# Patient Record
Sex: Female | Born: 2000 | Race: Black or African American | Hispanic: No | Marital: Single | Smoking: Never smoker
Health system: Southern US, Community
[De-identification: ages and names within clinical notes are randomized; demographics above are authoritative.]

## PROBLEM LIST (undated history)

## (undated) ENCOUNTER — Inpatient Hospital Stay (HOSPITAL_COMMUNITY): Payer: Self-pay

## (undated) DIAGNOSIS — J45909 Unspecified asthma, uncomplicated: Secondary | ICD-10-CM

## (undated) HISTORY — PX: NO PAST SURGERIES: SHX2092

---
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
## 2021-03-23 DIAGNOSIS — Z8619 Personal history of other infectious and parasitic diseases: Secondary | ICD-10-CM | POA: Diagnosis not present

## 2021-03-23 DIAGNOSIS — R300 Dysuria: Secondary | ICD-10-CM | POA: Diagnosis not present

## 2021-03-23 DIAGNOSIS — N898 Other specified noninflammatory disorders of vagina: Secondary | ICD-10-CM | POA: Diagnosis not present

## 2021-05-28 ENCOUNTER — Other Ambulatory Visit: Payer: Self-pay

## 2021-05-28 ENCOUNTER — Inpatient Hospital Stay (HOSPITAL_COMMUNITY)
Admission: AD | Admit: 2021-05-28 | Discharge: 2021-05-28 | Disposition: A | Discharge status: Home / Self Care | Payer: BC Managed Care – PPO | Attending: Obstetrics and Gynecology | Admitting: Obstetrics and Gynecology

## 2021-05-28 DIAGNOSIS — Z3202 Encounter for pregnancy test, result negative: Secondary | ICD-10-CM | POA: Diagnosis not present

## 2021-05-28 LAB — POCT PREGNANCY, URINE: Preg Test, Ur: NEGATIVE

## 2021-05-28 LAB — ABO/RH: ABO/RH(D): B POS

## 2021-05-28 LAB — HCG, QUANTITATIVE, PREGNANCY: hCG, Beta Chain, Quant, S: 5 m[IU]/mL — ABNORMAL HIGH (ref <5)

## 2021-05-28 NOTE — MAU Provider Note (Addendum)
Rebecca Noble is a 20 y.o. who presents to MAU today for pbleeding in the setting of 4 positive HPT, which she took 2 days ago. Having some cramping. Bleeding is similar to a period. regnancy verification. The patient denies abdominal pain or vaginal bleeding today.   BP 134/80 (BP Location: Right Arm)   Pulse 82   Temp 98.2 F (36.8 C) (Oral)   Resp 16   Ht 4\' 11"  (1.499 m)   Wt 52.2 kg   LMP 04/24/2021   SpO2 99%   BMI 23.25 kg/m   CONSTITUTIONAL: Well-developed, well-nourished female in no acute distress.  MUSCULOSKELETAL: Normal range of motion.  CARDIOVASCULAR: Regular heart rate RESPIRATORY: Normal effort NEUROLOGICAL: Alert and oriented to person, place, and time.  SKIN: No pallor. PSYCH: Normal mood and affect. Normal behavior. Normal judgment and thought content.  Results for orders placed or performed during the hospital encounter of 05/28/21 (from the past 24 hour(s))  Pregnancy, urine POC     Status: None   Collection Time: 05/28/21 10:44 AM  Result Value Ref Range   Preg Test, Ur NEGATIVE NEGATIVE  hCG, quantitative, pregnancy     Status: Abnormal   Collection Time: 05/28/21 11:19 AM  Result Value Ref Range   hCG, Beta Chain, Quant, S 5 (H) <5 mIU/mL  ABO/Rh     Status: None   Collection Time: 05/28/21 11:19 AM  Result Value Ref Range   ABO/RH(D) B POS    No rh immune globuloin      NOT A RH IMMUNE GLOBULIN CANDIDATE, PT RH POSITIVE Performed at [facility], [address], [place], [place]    MDM UPT negative, will draw quant and call pt with results. Consult with Dr. [id number], plan for f/u in 1 week.  1330: Notified pt of qhcg of 5, likely failed pregnancy, recommend f/u in 1 week for rpt qhcg.   A: 1. Negative pregnancy test     P: Follow up at [facility] in 1 week- office staff notified Discharge home Patient may return to MAU for pregnancy emergencies  [facility] Donette Larry  05/28/2021 1:30 PM

## 2021-05-28 NOTE — MAU Note (Signed)
+  HPT x4.  Tried to get into the health dept, they told her " their test doesn't pick up until you are 6 wks". Started bleeding yesterday, started as light pink, then became heavier.  Started cramping, doesn't know what is going on.

## 2021-06-04 DIAGNOSIS — O039 Complete or unspecified spontaneous abortion without complication: Secondary | ICD-10-CM | POA: Diagnosis not present

## 2021-06-18 DIAGNOSIS — O039 Complete or unspecified spontaneous abortion without complication: Secondary | ICD-10-CM | POA: Diagnosis not present

## 2021-09-16 NOTE — L&D Delivery Note (Signed)
Delivery Note    Patient Name: Rebecca Noble DOB: [birth year] MRN: [id number]  Date of admission: 06/21/2022 Delivering MD: Noralyn Pick  Date of delivery: 06/21/2022 Type of delivery: SVD  Newborn Data: Live born female  Birth Weight:   APGAR: 76, 60   Newborn Delivery   Birth date/time: [birth year]/2022 21:11:00 Delivery type:      Rebecca Noble, 21 y.o., @ [redacted]w[redacted]d,  G1P0, who was admitted for spontaneous latent labor. I was called to the room when she progressed 2+ station in the second stage of labor.  She pushed for 45/min.  She delivered a viable infant, cephalic and restituted to the LOA position over an intact perineum.  A nuchal cord   was not identified. The baby was placed on maternal abdomen while initial step of NRP were perfmored (Dry, Stimulated, and warmed). Hat placed on baby for thermoregulation. Delayed cord clamping was performed for 2 minutes.  Cord double clamped and cut.  Cord cut by FOB. Apgar scores were 8 and 9. Prophylactic Pitocin was started in the third stage of labor for active management. The placenta delivered spontaneously, shultz, with a 3 vessel cord and was sent to LD.  Inspection revealed none. An examination of the vaginal vault and cervix was free from lacerations. The uterus was firm, bleeding stable.   Placenta and umbilical artery blood gas were not sent.  There were no complications during the procedure.  Mom and baby skin to skin following delivery. Left in stable condition.  Maternal Info: Anesthesia: Epidural Episiotomy: no Lacerations:  no Suture Repair: no Est. Blood Loss (mL):  142mls  Newborn Info:  Baby Sex: female Circumcision: N/A Babies Name: Rebecca Noble APGAR (1 MIN):  8 APGAR (5 MINS):  9 APGAR (10 MINS):     Mom to postpartum.  Baby to Couplet care / Skin to Skin.   [place], FNP-C, PMHNP-BC  [address] [place], [place] [address]  Cell: [phone]  Office Phone: [phone] Fax: [phone] 06/21/2022  9:45  PM

## 2021-09-23 ENCOUNTER — Emergency Department (HOSPITAL_COMMUNITY)
Admission: EM | Admit: 2021-09-23 | Discharge: 2021-09-24 | Disposition: A | Discharge status: Home / Self Care | Payer: BC Managed Care – PPO | Attending: Emergency Medicine | Admitting: Emergency Medicine

## 2021-09-23 ENCOUNTER — Other Ambulatory Visit: Payer: Self-pay

## 2021-09-23 DIAGNOSIS — R112 Nausea with vomiting, unspecified: Secondary | ICD-10-CM | POA: Insufficient documentation

## 2021-09-23 DIAGNOSIS — Z5321 Procedure and treatment not carried out due to patient leaving prior to being seen by health care provider: Secondary | ICD-10-CM | POA: Diagnosis not present

## 2021-09-23 DIAGNOSIS — R6883 Chills (without fever): Secondary | ICD-10-CM | POA: Diagnosis not present

## 2021-09-23 DIAGNOSIS — N9489 Other specified conditions associated with female genital organs and menstrual cycle: Secondary | ICD-10-CM | POA: Insufficient documentation

## 2021-09-23 DIAGNOSIS — R55 Syncope and collapse: Secondary | ICD-10-CM | POA: Diagnosis not present

## 2021-09-23 LAB — LIPASE, BLOOD: Lipase: 24 U/L (ref 11–51)

## 2021-09-23 LAB — CBC WITH DIFFERENTIAL/PLATELET
Abs Immature Granulocytes: 0.05 10*3/uL (ref 0.00–0.07)
Basophils Absolute: 0 10*3/uL (ref 0.0–0.1)
Basophils Relative: 0 %
Eosinophils Absolute: 0 10*3/uL (ref 0.0–0.5)
Eosinophils Relative: 0 %
HCT: 41.4 % (ref 36.0–46.0)
Hemoglobin: 14.7 g/dL (ref 12.0–15.0)
Immature Granulocytes: 0 %
Lymphocytes Relative: 2 %
Lymphs Abs: 0.3 10*3/uL — ABNORMAL LOW (ref 0.7–4.0)
MCH: 30.2 pg (ref 26.0–34.0)
MCHC: 35.5 g/dL (ref 30.0–36.0)
MCV: 85 fL (ref 80.0–100.0)
Monocytes Absolute: 0.3 10*3/uL (ref 0.1–1.0)
Monocytes Relative: 2 %
Neutro Abs: 12.9 10*3/uL — ABNORMAL HIGH (ref 1.7–7.7)
Neutrophils Relative %: 96 %
Platelets: 412 10*3/uL — ABNORMAL HIGH (ref 150–400)
RBC: 4.87 MIL/uL (ref 3.87–5.11)
RDW: 12.1 % (ref 11.5–15.5)
WBC: 13.5 10*3/uL — ABNORMAL HIGH (ref 4.0–10.5)
nRBC: 0 % (ref 0.0–0.2)

## 2021-09-23 LAB — COMPREHENSIVE METABOLIC PANEL
ALT: 24 U/L (ref 0–44)
AST: 30 U/L (ref 15–41)
Albumin: 4.3 g/dL (ref 3.5–5.0)
Alkaline Phosphatase: 80 U/L (ref 38–126)
Anion gap: 12 (ref 5–15)
BUN: 8 mg/dL (ref 6–20)
CO2: 22 mmol/L (ref 22–32)
Calcium: 9.6 mg/dL (ref 8.9–10.3)
Chloride: 105 mmol/L (ref 98–111)
Creatinine, Ser: 0.75 mg/dL (ref 0.44–1.00)
GFR, Estimated: >60 mL/min (ref >60)
Glucose, Bld: 119 mg/dL — ABNORMAL HIGH (ref 70–99)
Potassium: 3.4 mmol/L — ABNORMAL LOW (ref 3.5–5.1)
Sodium: 139 mmol/L (ref 135–145)
Total Bilirubin: 0.9 mg/dL (ref 0.3–1.2)
Total Protein: 8.1 g/dL (ref 6.5–8.1)

## 2021-09-23 MED ORDER — SODIUM CHLORIDE 0.9 % IV BOLUS
1000.0000 mL | Freq: Once | INTRAVENOUS | Status: AC
  Administered 2021-09-23: 1000 mL via INTRAVENOUS

## 2021-09-23 MED ORDER — ONDANSETRON HCL 4 MG/2ML IJ SOLN
4.0000 mg | Freq: Once | INTRAMUSCULAR | Status: AC
  Administered 2021-09-23: 4 mg via INTRAVENOUS
  Filled 2021-09-23: qty 2

## 2021-09-23 NOTE — ED Notes (Signed)
Pt was standing up walking around in front of her mother and then passed out into mothers arms. This NT and EMT Iantha Fallen assisted pt back to chair and pt awakened. Vitals reassessed and triage RN made aware.

## 2021-09-23 NOTE — ED Provider Triage Note (Addendum)
Emergency Medicine Provider Triage Evaluation Note  Rebecca Noble , a 21 y.o. female  was evaluated in triage.  Pt complains of nausea and vomiting.  Other siblings also sick with similar.  Everyone in the house took at home covid test today-- all were negative. She did have syncopal episode this evening that prompted ED visit.  Review of Systems  Positive: Nausea, vomiting Negative: fever  Physical Exam  BP 126/73 (BP Location: Left Arm)    Pulse 89    Temp 98.9 F (37.2 C) (Oral)    Resp 17    Ht 4\' 11"  (1.499 m)    Wt 54.4 kg    SpO2 100%    BMI 24.24 kg/m   Gen:   Awake, no distress   Resp:  Normal effort  MSK:   Moves extremities without difficulty  Other:    Medical Decision Making  Medically screening exam initiated at 10:24 PM.  Appropriate orders placed.  Rebecca Noble was informed that the remainder of the evaluation will be completed by another provider, this initial triage assessment does not replace that evaluation, and the importance of remaining in the ED until their evaluation is complete.  Nausea and vomiting, syncopal event.  AAOx3 currently.  VSS.  EKG, labs.  Suspect likely viral process given sick siblings with same.  11:46 PM Brought back to triage after near syncopal event in the lobby after walking to bathroom.  Will insert PIV, start IVFB.   Noreene Filbert, PA-C 09/23/21 2230    11/21/21, PA-C 09/23/21 [phone]

## 2021-09-23 NOTE — ED Triage Notes (Signed)
Pt c/o nausea, vomiting, chills, and syncope since earlier today.

## 2021-09-24 LAB — I-STAT BETA HCG BLOOD, ED (MC, WL, AP ONLY): I-stat hCG, quantitative: <5 m[IU]/mL (ref <5)

## 2021-09-24 NOTE — ED Notes (Signed)
Pt stated that IV was hurting and requesting it to be removed.

## 2021-09-24 NOTE — ED Notes (Signed)
Pt did not want to stay and left AMA. °

## 2021-11-05 DIAGNOSIS — Z0000 Encounter for general adult medical examination without abnormal findings: Secondary | ICD-10-CM | POA: Diagnosis not present

## 2021-11-07 DIAGNOSIS — Z3201 Encounter for pregnancy test, result positive: Secondary | ICD-10-CM | POA: Diagnosis not present

## 2021-11-07 DIAGNOSIS — O26891 Other specified pregnancy related conditions, first trimester: Secondary | ICD-10-CM | POA: Diagnosis not present

## 2021-12-03 DIAGNOSIS — Z3481 Encounter for supervision of other normal pregnancy, first trimester: Secondary | ICD-10-CM | POA: Diagnosis not present

## 2022-01-02 DIAGNOSIS — Z3482 Encounter for supervision of other normal pregnancy, second trimester: Secondary | ICD-10-CM | POA: Diagnosis not present

## 2022-01-17 DIAGNOSIS — L089 Local infection of the skin and subcutaneous tissue, unspecified: Secondary | ICD-10-CM | POA: Diagnosis not present

## 2022-01-17 DIAGNOSIS — L03317 Cellulitis of buttock: Secondary | ICD-10-CM | POA: Diagnosis not present

## 2022-01-17 DIAGNOSIS — B9689 Other specified bacterial agents as the cause of diseases classified elsewhere: Secondary | ICD-10-CM | POA: Diagnosis not present

## 2022-02-04 DIAGNOSIS — Z36 Encounter for antenatal screening for chromosomal anomalies: Secondary | ICD-10-CM | POA: Diagnosis not present

## 2022-02-04 DIAGNOSIS — O35EXX0 Maternal care for other (suspected) fetal abnormality and damage, fetal genitourinary anomalies, not applicable or unspecified: Secondary | ICD-10-CM | POA: Diagnosis not present

## 2022-02-04 DIAGNOSIS — Z3490 Encounter for supervision of normal pregnancy, unspecified, unspecified trimester: Secondary | ICD-10-CM | POA: Diagnosis not present

## 2022-02-10 ENCOUNTER — Other Ambulatory Visit: Payer: Self-pay

## 2022-02-10 ENCOUNTER — Encounter (HOSPITAL_COMMUNITY): Payer: Self-pay

## 2022-02-10 ENCOUNTER — Inpatient Hospital Stay (HOSPITAL_BASED_OUTPATIENT_CLINIC_OR_DEPARTMENT_OTHER): Payer: BC Managed Care – PPO

## 2022-02-10 ENCOUNTER — Inpatient Hospital Stay (HOSPITAL_COMMUNITY)
Admission: AD | Admit: 2022-02-10 | Discharge: 2022-02-10 | Disposition: A | Discharge status: Home / Self Care | Payer: BC Managed Care – PPO | Attending: Obstetrics and Gynecology | Admitting: Obstetrics and Gynecology

## 2022-02-10 DIAGNOSIS — W228XXA Striking against or struck by other objects, initial encounter: Secondary | ICD-10-CM | POA: Insufficient documentation

## 2022-02-10 DIAGNOSIS — O9A212 Injury, poisoning and certain other consequences of external causes complicating pregnancy, second trimester: Secondary | ICD-10-CM | POA: Diagnosis not present

## 2022-02-10 DIAGNOSIS — S3991XA Unspecified injury of abdomen, initial encounter: Secondary | ICD-10-CM | POA: Insufficient documentation

## 2022-02-10 DIAGNOSIS — O9A219 Injury, poisoning and certain other consequences of external causes complicating pregnancy, unspecified trimester: Secondary | ICD-10-CM | POA: Diagnosis not present

## 2022-02-10 DIAGNOSIS — W500XXA Accidental hit or strike by another person, initial encounter: Secondary | ICD-10-CM

## 2022-02-10 DIAGNOSIS — Z3A21 21 weeks gestation of pregnancy: Secondary | ICD-10-CM

## 2022-02-10 DIAGNOSIS — Z3492 Encounter for supervision of normal pregnancy, unspecified, second trimester: Secondary | ICD-10-CM

## 2022-02-10 DIAGNOSIS — R109 Unspecified abdominal pain: Secondary | ICD-10-CM | POA: Insufficient documentation

## 2022-02-10 LAB — URINALYSIS, ROUTINE W REFLEX MICROSCOPIC
Bilirubin Urine: NEGATIVE
Glucose, UA: NEGATIVE mg/dL
Hgb urine dipstick: NEGATIVE
Ketones, ur: NEGATIVE mg/dL
Leukocytes,Ua: NEGATIVE
Nitrite: NEGATIVE
Protein, ur: NEGATIVE mg/dL
Specific Gravity, Urine: 1.01 (ref 1.005–1.030)
pH: 6 (ref 5.0–8.0)

## 2022-02-10 LAB — POCT PREGNANCY, URINE: Preg Test, Ur: POSITIVE — AB

## 2022-02-10 MED ORDER — ACETAMINOPHEN 325 MG PO TABS: 650.0000 mg | ORAL_TABLET | ORAL | 0 refills | Status: AC | PRN

## 2022-02-10 MED ORDER — CYCLOBENZAPRINE HCL 5 MG PO TABS
10.0000 mg | ORAL_TABLET | Freq: Once | ORAL | Status: AC
  Administered 2022-02-10: 10 mg via ORAL
  Filled 2022-02-10: qty 2

## 2022-02-10 MED ORDER — ACETAMINOPHEN 500 MG PO TABS
1000.0000 mg | ORAL_TABLET | Freq: Once | ORAL | Status: AC
  Administered 2022-02-10: 1000 mg via ORAL
  Filled 2022-02-10: qty 2

## 2022-02-10 MED ORDER — CYCLOBENZAPRINE HCL 10 MG PO TABS: 10.0000 mg | ORAL_TABLET | Freq: Two times a day (BID) | ORAL | 0 refills | Status: AC | PRN
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
## 2022-03-04 DIAGNOSIS — Z3482 Encounter for supervision of other normal pregnancy, second trimester: Secondary | ICD-10-CM | POA: Diagnosis not present

## 2022-03-04 DIAGNOSIS — Z3490 Encounter for supervision of normal pregnancy, unspecified, unspecified trimester: Secondary | ICD-10-CM | POA: Diagnosis not present

## 2022-03-04 LAB — OB RESULTS CONSOLE ABO/RH: RH Type: POSITIVE

## 2022-03-04 LAB — OB RESULTS CONSOLE GC/CHLAMYDIA
Chlamydia: NEGATIVE
Neisseria Gonorrhea: NEGATIVE

## 2022-03-04 LAB — OB RESULTS CONSOLE RPR: RPR: NONREACTIVE

## 2022-03-04 LAB — OB RESULTS CONSOLE HEPATITIS B SURFACE ANTIGEN: Hepatitis B Surface Ag: NEGATIVE

## 2022-03-04 LAB — OB RESULTS CONSOLE HIV ANTIBODY (ROUTINE TESTING): HIV: NONREACTIVE

## 2022-03-04 LAB — HEPATITIS C ANTIBODY: HCV Ab: NEGATIVE

## 2022-03-04 LAB — OB RESULTS CONSOLE RUBELLA ANTIBODY, IGM: Rubella: IMMUNE

## 2022-04-05 DIAGNOSIS — Z23 Encounter for immunization: Secondary | ICD-10-CM | POA: Diagnosis not present

## 2022-04-05 DIAGNOSIS — Z3483 Encounter for supervision of other normal pregnancy, third trimester: Secondary | ICD-10-CM | POA: Diagnosis not present

## 2022-04-10 DIAGNOSIS — B3731 Acute candidiasis of vulva and vagina: Secondary | ICD-10-CM | POA: Diagnosis not present

## 2022-04-24 DIAGNOSIS — Z3483 Encounter for supervision of other normal pregnancy, third trimester: Secondary | ICD-10-CM | POA: Diagnosis not present

## 2022-05-30 DIAGNOSIS — Z3483 Encounter for supervision of other normal pregnancy, third trimester: Secondary | ICD-10-CM | POA: Diagnosis not present

## 2022-05-30 DIAGNOSIS — O26893 Other specified pregnancy related conditions, third trimester: Secondary | ICD-10-CM | POA: Diagnosis not present

## 2022-05-30 DIAGNOSIS — O26843 Uterine size-date discrepancy, third trimester: Secondary | ICD-10-CM | POA: Diagnosis not present

## 2022-05-30 DIAGNOSIS — Z3490 Encounter for supervision of normal pregnancy, unspecified, unspecified trimester: Secondary | ICD-10-CM | POA: Diagnosis not present

## 2022-05-30 LAB — OB RESULTS CONSOLE GBS: GBS: NEGATIVE

## 2022-06-04 DIAGNOSIS — R112 Nausea with vomiting, unspecified: Secondary | ICD-10-CM | POA: Diagnosis not present

## 2022-06-21 ENCOUNTER — Encounter (HOSPITAL_COMMUNITY): Payer: Self-pay | Admitting: Obstetrics and Gynecology

## 2022-06-21 ENCOUNTER — Inpatient Hospital Stay (HOSPITAL_COMMUNITY): Payer: BC Managed Care – PPO | Admitting: Anesthesiology

## 2022-06-21 ENCOUNTER — Inpatient Hospital Stay (HOSPITAL_COMMUNITY)
Admission: AD | Admit: 2022-06-21 | Discharge: 2022-06-23 | DRG: 806 | Disposition: A | Discharge status: Home / Self Care | Payer: BC Managed Care – PPO | Attending: Obstetrics & Gynecology | Admitting: Obstetrics & Gynecology

## 2022-06-21 ENCOUNTER — Inpatient Hospital Stay (EMERGENCY_DEPARTMENT_HOSPITAL)
Admission: AD | Admit: 2022-06-21 | Discharge: 2022-06-21 | Disposition: A | Discharge status: Home / Self Care | Payer: BC Managed Care – PPO | Source: Home / Self Care | Attending: Obstetrics and Gynecology | Admitting: Obstetrics and Gynecology

## 2022-06-21 ENCOUNTER — Other Ambulatory Visit: Payer: Self-pay

## 2022-06-21 DIAGNOSIS — O471 False labor at or after 37 completed weeks of gestation: Secondary | ICD-10-CM | POA: Insufficient documentation

## 2022-06-21 DIAGNOSIS — O139 Gestational [pregnancy-induced] hypertension without significant proteinuria, unspecified trimester: Secondary | ICD-10-CM | POA: Diagnosis not present

## 2022-06-21 DIAGNOSIS — O212 Late vomiting of pregnancy: Secondary | ICD-10-CM | POA: Insufficient documentation

## 2022-06-21 DIAGNOSIS — O479 False labor, unspecified: Secondary | ICD-10-CM | POA: Diagnosis not present

## 2022-06-21 DIAGNOSIS — D62 Acute posthemorrhagic anemia: Secondary | ICD-10-CM | POA: Diagnosis not present

## 2022-06-21 DIAGNOSIS — O26893 Other specified pregnancy related conditions, third trimester: Secondary | ICD-10-CM | POA: Diagnosis not present

## 2022-06-21 DIAGNOSIS — Z3A39 39 weeks gestation of pregnancy: Secondary | ICD-10-CM

## 2022-06-21 DIAGNOSIS — O134 Gestational [pregnancy-induced] hypertension without significant proteinuria, complicating childbirth: Secondary | ICD-10-CM | POA: Diagnosis not present

## 2022-06-21 DIAGNOSIS — O9081 Anemia of the puerperium: Secondary | ICD-10-CM | POA: Diagnosis not present

## 2022-06-21 DIAGNOSIS — J45909 Unspecified asthma, uncomplicated: Secondary | ICD-10-CM | POA: Diagnosis not present

## 2022-06-21 DIAGNOSIS — O9952 Diseases of the respiratory system complicating childbirth: Secondary | ICD-10-CM | POA: Diagnosis not present

## 2022-06-21 LAB — CBC
HCT: 35.3 % — ABNORMAL LOW (ref 36.0–46.0)
Hemoglobin: 11.9 g/dL — ABNORMAL LOW (ref 12.0–15.0)
MCH: 27.9 pg (ref 26.0–34.0)
MCHC: 33.7 g/dL (ref 30.0–36.0)
MCV: 82.7 fL (ref 80.0–100.0)
Platelets: 313 10*3/uL (ref 150–400)
RBC: 4.27 MIL/uL (ref 3.87–5.11)
RDW: 12.4 % (ref 11.5–15.5)
WBC: 13.9 10*3/uL — ABNORMAL HIGH (ref 4.0–10.5)
nRBC: 0 % (ref 0.0–0.2)

## 2022-06-21 LAB — TYPE AND SCREEN
ABO/RH(D): B POS
Antibody Screen: NEGATIVE

## 2022-06-21 MED ORDER — DIPHENHYDRAMINE HCL 50 MG/ML IJ SOLN: 12.5000 mg | INTRAMUSCULAR | Status: DC | PRN

## 2022-06-21 MED ORDER — ONDANSETRON HCL 4 MG/2ML IJ SOLN: 4.0000 mg | INTRAMUSCULAR | Status: DC | PRN

## 2022-06-21 MED ORDER — FLEET ENEMA 7-19 GM/118ML RE ENEM: 1.0000 | ENEMA | RECTAL | Status: DC | PRN

## 2022-06-21 MED ORDER — OXYTOCIN-SODIUM CHLORIDE 30-0.9 UT/500ML-% IV SOLN
2.5000 [IU]/h | INTRAVENOUS | Status: DC
  Administered 2022-06-21: 2.5 [IU]/h via INTRAVENOUS
  Filled 2022-06-21: qty 500

## 2022-06-21 MED ORDER — LACTATED RINGERS IV SOLN: INTRAVENOUS | Status: DC

## 2022-06-21 MED ORDER — BENZOCAINE-MENTHOL 20-0.5 % EX AERO
1.0000 | INHALATION_SPRAY | CUTANEOUS | Status: DC | PRN
  Administered 2022-06-22 (×2): 1 via TOPICAL
  Filled 2022-06-21 (×2): qty 56

## 2022-06-21 MED ORDER — DIPHENHYDRAMINE HCL 25 MG PO CAPS
25.0000 mg | ORAL_CAPSULE | Freq: Four times a day (QID) | ORAL | Status: DC | PRN
  Administered 2022-06-22: 25 mg via ORAL
  Filled 2022-06-21: qty 1

## 2022-06-21 MED ORDER — FENTANYL-BUPIVACAINE-NACL 0.5-0.125-0.9 MG/250ML-% EP SOLN
EPIDURAL | Status: DC | PRN
  Administered 2022-06-21: 12 mL/h via EPIDURAL

## 2022-06-21 MED ORDER — OXYCODONE-ACETAMINOPHEN 5-325 MG PO TABS: 2.0000 | ORAL_TABLET | ORAL | Status: DC | PRN

## 2022-06-21 MED ORDER — SIMETHICONE 80 MG PO CHEW: 80.0000 mg | CHEWABLE_TABLET | ORAL | Status: DC | PRN

## 2022-06-21 MED ORDER — SENNOSIDES-DOCUSATE SODIUM 8.6-50 MG PO TABS
2.0000 | ORAL_TABLET | Freq: Every day | ORAL | Status: DC
  Administered 2022-06-22 – 2022-06-23 (×2): 2 via ORAL
  Filled 2022-06-21 (×2): qty 2

## 2022-06-21 MED ORDER — COCONUT OIL OIL: 1.0000 | TOPICAL_OIL | Status: DC | PRN

## 2022-06-21 MED ORDER — ONDANSETRON 4 MG PO TBDP
4.0000 mg | ORAL_TABLET | Freq: Once | ORAL | Status: AC
  Administered 2022-06-21: 4 mg via ORAL
  Filled 2022-06-21: qty 1

## 2022-06-21 MED ORDER — LIDOCAINE HCL (PF) 1 % IJ SOLN
INTRAMUSCULAR | Status: DC | PRN
  Administered 2022-06-21: 5 mL via EPIDURAL

## 2022-06-21 MED ORDER — SOD CITRATE-CITRIC ACID 500-334 MG/5ML PO SOLN: 30.0000 mL | ORAL | Status: DC | PRN

## 2022-06-21 MED ORDER — MORPHINE SULFATE (PF) 4 MG/ML IV SOLN
4.0000 mg | Freq: Once | INTRAVENOUS | Status: AC
  Administered 2022-06-21: 4 mg via INTRAMUSCULAR
  Filled 2022-06-21: qty 1

## 2022-06-21 MED ORDER — OXYCODONE-ACETAMINOPHEN 5-325 MG PO TABS: 1.0000 | ORAL_TABLET | ORAL | Status: DC | PRN

## 2022-06-21 MED ORDER — OXYTOCIN BOLUS FROM INFUSION: 333.0000 mL | Freq: Once | INTRAVENOUS | Status: DC

## 2022-06-21 MED ORDER — ZOLPIDEM TARTRATE 5 MG PO TABS: 5.0000 mg | ORAL_TABLET | Freq: Every evening | ORAL | Status: DC | PRN

## 2022-06-21 MED ORDER — LACTATED RINGERS IV SOLN: 500.0000 mL | INTRAVENOUS | Status: DC | PRN

## 2022-06-21 MED ORDER — IBUPROFEN 600 MG PO TABS
600.0000 mg | ORAL_TABLET | Freq: Four times a day (QID) | ORAL | Status: DC
  Administered 2022-06-22: 600 mg via ORAL
  Filled 2022-06-21: qty 1

## 2022-06-21 MED ORDER — ONDANSETRON HCL 4 MG/2ML IJ SOLN: 4.0000 mg | Freq: Four times a day (QID) | INTRAMUSCULAR | Status: DC | PRN

## 2022-06-21 MED ORDER — ACETAMINOPHEN 325 MG PO TABS: 650.0000 mg | ORAL_TABLET | ORAL | Status: DC | PRN

## 2022-06-21 MED ORDER — PHENYLEPHRINE 80 MCG/ML (10ML) SYRINGE FOR IV PUSH (FOR BLOOD PRESSURE SUPPORT): 80.0000 ug | PREFILLED_SYRINGE | INTRAVENOUS | Status: DC | PRN

## 2022-06-21 MED ORDER — FENTANYL-BUPIVACAINE-NACL 0.5-0.125-0.9 MG/250ML-% EP SOLN
12.0000 mL/h | EPIDURAL | Status: DC | PRN
  Filled 2022-06-21: qty 250

## 2022-06-21 MED ORDER — EPHEDRINE 5 MG/ML INJ: 10.0000 mg | INTRAVENOUS | Status: DC | PRN

## 2022-06-21 MED ORDER — ONDANSETRON HCL 4 MG PO TABS: 4.0000 mg | ORAL_TABLET | ORAL | Status: DC | PRN

## 2022-06-21 MED ORDER — LACTATED RINGERS IV SOLN
500.0000 mL | Freq: Once | INTRAVENOUS | Status: AC
  Administered 2022-06-21: 500 mL via INTRAVENOUS

## 2022-06-21 MED ORDER — TETANUS-DIPHTH-ACELL PERTUSSIS 5-2.5-18.5 LF-MCG/0.5 IM SUSY: 0.5000 mL | PREFILLED_SYRINGE | Freq: Once | INTRAMUSCULAR | Status: DC

## 2022-06-21 MED ORDER — WITCH HAZEL-GLYCERIN EX PADS: 1.0000 | MEDICATED_PAD | CUTANEOUS | Status: DC | PRN

## 2022-06-21 MED ORDER — LIDOCAINE HCL (PF) 1 % IJ SOLN: 30.0000 mL | INTRAMUSCULAR | Status: DC | PRN

## 2022-06-21 MED ORDER — DIBUCAINE (PERIANAL) 1 % EX OINT: 1.0000 | TOPICAL_OINTMENT | CUTANEOUS | Status: DC | PRN

## 2022-06-21 NOTE — Anesthesia Procedure Notes (Signed)
Epidural Patient location during procedure: OB Start time: 06/21/2022 6:10 PM End time: 06/21/2022 6:24 PM  Staffing Anesthesiologist: Barnet Glasgow, MD Performed: anesthesiologist   Preanesthetic Checklist Completed: patient identified, IV checked, site marked, risks and benefits discussed, surgical consent, monitors and equipment checked, pre-op evaluation and timeout performed  Epidural Patient position: sitting Prep: DuraPrep and site prepped and draped Patient monitoring: continuous pulse ox and blood pressure Approach: midline Location: L3-L4 Injection technique: LOR air  Needle:  Needle type: Tuohy  Needle gauge: 17 G Needle length: 9 cm and 9 Needle insertion depth: 7 cm Catheter type: closed end flexible Catheter size: 19 Gauge Catheter at skin depth: 12 cm Test dose: negative  Assessment Events: blood not aspirated, injection not painful, no injection resistance, no paresthesia and negative IV test  Additional Notes Patient identified. Risks/Benefits/Options discussed with patient including but not limited to bleeding, infection, nerve damage, paralysis, failed block, incomplete pain control, headache, blood pressure changes, nausea, vomiting, reactions to medication both or allergic, itching and postpartum back pain. Confirmed with bedside nurse the patient's most recent platelet count. Confirmed with patient that they are not currently taking any anticoagulation, have any bleeding history or any family history of bleeding disorders. Patient expressed understanding and wished to proceed. All questions were answered. Sterile technique was used throughout the entire procedure. Please see nursing notes for vital signs. Test dose was given through epidural needle and negative prior to continuing to dose epidural or start infusion. Warning signs of high block given to the patient including shortness of breath, tingling/numbness in hands, complete motor block, or any  concerning symptoms with instructions to call for help. Patient was given instructions on fall risk and not to get out of bed. All questions and concerns addressed with instructions to call with any issues.  1 Attempt (S) . Patient tolerated procedure well.

## 2022-06-21 NOTE — Discharge Instructions (Signed)
The [employer] This circuit takes at least 90 minutes to complete so clear your schedule and make mental preparations so you can relax in your environment.  The second step requires a lot of pillows so gather them up before beginning.  Before starting, you should empty your bladder! Have a nice drink nearby, and make sure it has a straw! If you are having contractions, this circuit should be done through contractions, try not to change positions between steps.  Step One: Open-kneeChest Stay in this position for 30 minutes, start in cat/cow, then drop your chest as low as you can to the bed or the floor and your bottom as high as you can. Knees should be fairly wide apart, and the angle between the torso/thighs should be wider than 90 degrees. Wiggle around, prop with lots of pillows and use this time to get totally relaxed. This position allows the baby to scoot out of the pelvis a bit and gives them room to rotate, shift their head position, etc. If the pregnant person finds it helpful, careful positioning with a rebozo under the belly, with gentle tension from a support person behind can help maintain this position for the full 30 minutes.  Step Two:ExaggeratedLeft SideLying Roll to your left side, bringing your top leg as high as possible and keeping your bottom leg straight. Roll forward as much as possible, again using a lot of pillows. Sink into the bed and relax some more. If you fall asleep, that's totally okay and you can stay there! If not, stay here for at least another half an hour. Try and get your top right leg up towards your head and get as rolled over onto your belly as much as possible. If you repeat the circuit during labor, try alternating left and right sides. We know the photo the left is actually right side... just flip the image in your head.  Step Three: Moving and Lunges Lunge, walk stairs facing sideways, 2 at a time, (have a spotter  downstairs of you!), take a walk outside with one foot on the curb and the other on the street, sit on a birth ball and hula- anything that's upright and putting your pelvis in open, asymmetrical positions. Spend at least 30 minutes doing this one as well to give your baby a chance to move down. If you are lunging or stair or curb walking, you should lunge/walk/go up stairs in the direction that feels better to you. The key with the lunge is that the toes of the higher leg and mom's belly button should be at right angles. Do not lunge over your knee, that closes the pelvis.  You got this!!!!!!!!!!!!!!!!!!!!!!! :)  

## 2022-06-21 NOTE — Progress Notes (Signed)
Subjective: Postpartum Day # 1 : S/P NSVD due to spontaneous latent labor, gbs- H/O MDD, GAD no meds, mood stable, ashtma no meds required during this stay, elevated BP with new on criteria for GHTN, labs WNL BP currently normotensive postpartum 93/50, asymptomatic, no PCR taken, pt progressed and had a SVD over intact perineum, ebl of 166mls, with hgb drop of 11.1-9.3. Patient up ad lib, denies syncope or dizziness. Reports consuming regular diet without issues and denies N/V. Patient reports 0 bowel movement + passing flatus.  Denies issues with urination and reports bleeding is "lighter."  Patient is breastfeeding and reports going well.  Desires undecided for postpartum contraception.  Pain is being appropriately managed with use of po meds. Denies SI/HI, denies HA, RUQ pain or vision changes.   No laceration Feeding:  Breast Contraceptive plan:  undecided Baby Female  Objective: Vital signs in last 24 hours: Patient Vitals for the past 24 hrs:  BP Temp Temp src Pulse Resp SpO2  06/22/22 0830 119/79 98.3 F (36.8 C) Oral 75 16 100 %  06/22/22 0426 (!) 93/50 98 F (36.7 C) Oral 77 18 --  06/22/22 0046 120/62 98.1 F (36.7 C) Oral 87 18 --  06/21/22 2343 124/71 98 F (36.7 C) Oral (!) 103 18 --  06/21/22 2228 113/60 -- -- 93 -- --  06/21/22 2215 92/68 -- -- (!) 171 -- --  06/21/22 2200 124/79 -- -- (!) 107 -- --  06/21/22 2146 128/71 -- -- (!) 111 -- --  06/21/22 2130 128/65 -- -- (!) 109 -- --  06/21/22 2117 -- -- -- (!) 114 -- --  06/21/22 2111 -- -- -- -- -- 99 %  06/21/22 2033 (!) 125/98 -- -- (!) 209 -- --  06/21/22 2001 116/60 -- -- 87 -- --  06/21/22 1931 104/78 -- -- 81 -- --  06/21/22 1911 123/66 -- -- 87 -- --  06/21/22 1906 121/62 -- -- (!) 117 -- --  06/21/22 1905 -- -- -- -- -- 100 %  06/21/22 1901 130/76 -- -- 88 -- --  06/21/22 1900 -- -- -- -- -- 100 %  06/21/22 1856 127/71 -- -- 85 -- --  06/21/22 1855 -- -- -- -- -- 100 %  06/21/22 1851 135/78 -- -- 80  -- --  06/21/22 1850 -- -- -- -- -- 100 %  06/21/22 1846 129/74 -- -- 91 -- --  06/21/22 1845 -- -- -- -- -- 100 %  06/21/22 1841 136/66 -- -- 90 -- --  06/21/22 1840 -- -- -- -- -- 100 %  06/21/22 1836 137/79 -- -- 93 -- --  06/21/22 1835 -- -- -- -- -- 100 %  06/21/22 1831 137/70 -- -- 87 -- --  06/21/22 1830 -- -- -- -- -- 100 %  06/21/22 1825 (!) 149/73 -- -- (!) 117 -- 100 %  06/21/22 1822 (!) 150/75 -- -- 92 -- --  06/21/22 1819 (!) 152/92 -- -- (!) 103 -- 99 %  06/21/22 1748 (!) 153/88 98.7 F (37.1 C) Oral (!) 110 -- --  06/21/22 1714 (!) 145/83 -- -- 81 -- --  06/21/22 1657 (!) 141/74 98.4 F (36.9 C) Oral 86 20 100 %     Physical Exam:  General: alert, cooperative, and appears stated age Mood/Affect: happy Lungs: clear to auscultation, no wheezes, rales or rhonchi, symmetric air entry.  Heart: normal rate, regular rhythm, normal S1, S2, no murmurs, rubs, clicks or gallops. Breast: breasts  appear normal, no suspicious masses, no skin or nipple changes or axillary nodes. Abdomen:  + bowel sounds, soft, non-tender GU: perineum intact, healing well. No signs of external hematomas.  Uterine Fundus: firm Lochia: appropriate Skin: Warm, Dry. DVT Evaluation: No evidence of DVT seen on physical exam. Negative Homan's sign. No cords or calf tenderness. No significant calf/ankle edema.     Latest Ref Rng & Units 06/22/2022    4:25 AM 06/21/2022    5:30 PM 09/23/2021   10:27 PM  CBC  WBC 4.0 - 10.5 K/uL 13.5  13.9  13.5   Hemoglobin 12.0 - 15.0 g/dL 9.3  54.0  08.6   Hematocrit 36.0 - 46.0 % 26.7  35.3  41.4   Platelets 150 - 400 K/uL 227  313  412     Results for orders placed or performed during the hospital encounter of 06/21/22 (from the past 24 hour(s))  CBC     Status: Abnormal   Collection Time: 06/21/22  5:30 PM  Result Value Ref Range   WBC 13.9 (H) 4.0 - 10.5 K/uL   RBC 4.27 3.87 - 5.11 MIL/uL   Hemoglobin 11.9 (L) 12.0 - 15.0 g/dL   HCT 76.1 (L) 95.0 - 93.2  %   MCV 82.7 80.0 - 100.0 fL   MCH 27.9 26.0 - 34.0 pg   MCHC 33.7 30.0 - 36.0 g/dL   RDW 67.1 24.5 - 80.9 %   Platelets 313 150 - 400 K/uL   nRBC 0.0 0.0 - 0.2 %  Type and screen [facility]     Status: None   Collection Time: 06/21/22  5:30 PM  Result Value Ref Range   ABO/RH(D) B POS    Antibody Screen NEG    Sample Expiration      06/24/2022,2359 Performed at [facility], [address] [place], [place] [address]   Comprehensive metabolic panel     Status: Abnormal   Collection Time: 06/22/22  4:25 AM  Result Value Ref Range   Sodium 139 135 - 145 mmol/L   Potassium 3.6 3.5 - 5.1 mmol/L   Chloride 110 98 - 111 mmol/L   CO2 21 (L) 22 - 32 mmol/L   Glucose, Bld 97 70 - 99 mg/dL   BUN 6 6 - 20 mg/dL   Creatinine, Ser 2.50 0.44 - 1.00 mg/dL   Calcium 9.1 8.9 - 53.9 mg/dL   Total Protein 5.3 (L) 6.5 - 8.1 g/dL   Albumin 2.3 (L) 3.5 - 5.0 g/dL   AST 28 15 - 41 U/L   ALT 17 0 - 44 U/L   Alkaline Phosphatase 91 38 - 126 U/L   Total Bilirubin 0.4 0.3 - 1.2 mg/dL   GFR, Estimated >76 >73 mL/min   Anion gap 8 5 - 15  CBC     Status: Abnormal   Collection Time: 06/22/22  4:25 AM  Result Value Ref Range   WBC 13.5 (H) 4.0 - 10.5 K/uL   RBC 3.23 (L) 3.87 - 5.11 MIL/uL   Hemoglobin 9.3 (L) 12.0 - 15.0 g/dL   HCT 41.9 (L) 37.9 - 02.4 %   MCV 82.7 80.0 - 100.0 fL   MCH 28.8 26.0 - 34.0 pg   MCHC 34.8 30.0 - 36.0 g/dL   RDW 09.7 35.3 - 29.9 %   Platelets 227 150 - 400 K/uL   nRBC 0.0 0.0 - 0.2 %     CBG (last 3)  No results for input(s): "  GLUCAP" in the last 72 hours.   I/O last 3 completed shifts: In: -  Out: 175 [Blood:175]   Assessment Postpartum Day # 1: S/P NSVD due to spontaneous latent labor, gbs- H/O MDD, GAD no meds, mood stable, ashtma no meds required during this stay, elevated BP with new on criteria for GHTN, labs WNL BP currently normotensive postpartum 93/50, asymptomatic, no PCR taken, pt progressed and had a SVD over intact  perineum, ebl of , with hgb drop of 11.1-9.3. Pt stable. -1 involution. Breastfeeding. Hemodynamically stable.   Plan: Continue other mgmt as ordered ABLA: PO Iron GHTN: Monitor BP, will do PCR if BP >140/90 H/O MDD/GAD: monitor mood.  VTE prophylactics: Early ambulated as tolerates.  Pain control: Motrin/Tylenol PRN Education given regarding options for contraception, including barrier methods, injectable contraception, IUD placement, oral contraceptives.  Plan for discharge tomorrow, Breastfeeding, Lactation consult, and Social Work consult  Dr. Sallye Ober to be updated on patient status  [facility] CNM, FNP-C, PMHNP-BC  [address]  [place], [place] [address]  Cell: [phone]  Office Phone: [phone] Fax: [phone] 06/22/2022  10:47 AM

## 2022-06-21 NOTE — MAU Provider Note (Signed)
None      S: Ms. Rebecca Noble is a 21 y.o. G1P0 at [redacted]w[redacted]d  who presents to MAU today complaining contractions q 5-7 minutes.  She denies vaginal bleeding. She denies LOF. She reports normal fetal movement.    O: BP 126/72 (BP Location: Right Arm)   Pulse 90   Temp 98.1 F (36.7 C) (Oral)   Resp 16   Ht 4\' 11"  (1.499 m)   Wt 74.9 kg   LMP 09/16/2021   SpO2 100%   BMI 33.37 kg/m  GENERAL: Well-developed, well-nourished female in no acute distress.  HEAD: Normocephalic, atraumatic.  CHEST: Normal effort of breathing, regular heart rate ABDOMEN: Soft, nontender, gravid  Initial Cervical exam:  Dilation: 3.0 Effacement (%): 50 Cervical Position: Posterior Station: -3 Exam by:: Genene Churn, RN   Fetal Monitoring: Baseline: 120 bpm Variability: moderate variability  Accelerations: present  Decelerations: absent  Contractions: 8-10 mins.   Recheck 1 hour later:  Dilation: 3.5 Effacement (%): 50 Cervical Position: Posterior Station: -3 Exam by:: Genene Churn, RN  Patient vomiting with every contraction. Therapeutic rest and OTD Zofran ordered.    A: SIUP at [redacted]w[redacted]d  - False labor - Repeat exam unchanged @ 0815 am - Plan for discharge.    P: 1. False labor   2. [redacted] weeks gestation of pregnancy    - Discussed early labor versus active labor. Reviewed signs and symptoms of labor.  - Worsening and return precautions reviewed.  - FHT Cat I upon discharge  - Patient discharged home in stable condition and may return to MAU as needed.   Deloris Ping, CNM 06/21/2022 7:48 AM

## 2022-06-21 NOTE — Lactation Note (Signed)
This note was copied from a baby's chart. Lactation Consultation Note  Patient Name: Rebecca Noble QQPYP'P Date: 06/21/2022 Reason for consult: L&D Initial assessment;Primapara;Term Age:20 hours Baby latched to mom w/o any difficulty. Praised mom. Newborn feeding habits, STS, I&O, supply and demand reviewed. Mom encouraged to feed baby 8-12 times/24 hours and with feeding cues.   Mom doesn't have any questions at this time. Mom is getting sleeping from BF. Leaving for parents to bond and BF baby.  Maternal Data Has patient been taught Hand Expression?: Yes Does the patient have breastfeeding experience prior to this delivery?: No  Feeding    LATCH Score Latch: Grasps breast easily, tongue down, lips flanged, rhythmical sucking.  Audible Swallowing: None  Type of Nipple: Everted at rest and after stimulation  Comfort (Breast/Nipple): Soft / non-tender  Hold (Positioning): Assistance needed to correctly position infant at breast and maintain latch.  LATCH Score: 7   Lactation Tools Discussed/Used    Interventions Interventions: Breast feeding basics reviewed;Adjust position;Assisted with latch;Support pillows;Skin to skin;Breast massage;Hand express;Breast compression  Discharge    Consult Status Consult Status: Follow-up Date: 06/22/22 Follow-up type: In-patient    Theodoro Kalata 06/21/2022, 9:56 PM

## 2022-06-21 NOTE — MAU Note (Signed)
..  Rebecca Noble is a 21 y.o. at [redacted]w[redacted]d here in MAU reporting: contractions every 5-7 minutes. Denies vaginal bleeding or leaking of fluid. +FM Reports she was 3 cm in the office.   Pain score: 7/10

## 2022-06-21 NOTE — Anesthesia Preprocedure Evaluation (Signed)
Anesthesia Evaluation  Patient identified by MRN, date of birth, ID band Patient awake    Reviewed: Allergy & Precautions, NPO status , Patient's Chart, lab work & pertinent test results  Airway Mallampati: II  TM Distance: >3 FB Neck ROM: Full    Dental no notable dental hx. (+) Teeth Intact, Dental Advisory Given   Pulmonary asthma ,    Pulmonary exam normal breath sounds clear to auscultation       Cardiovascular Exercise Tolerance: Good Normal cardiovascular exam Rhythm:Regular Rate:Normal     Neuro/Psych    GI/Hepatic Neg liver ROS,   Endo/Other    Renal/GU negative Renal ROS     Musculoskeletal   Abdominal   Peds  Hematology Lab Results      Component                Value               Date                      WBC                      13.9 (H)            06/21/2022                HGB                      11.9 (L)            06/21/2022                HCT                      35.3 (L)            06/21/2022                MCV                      82.7                06/21/2022                PLT                      313                 06/21/2022              Anesthesia Other Findings   Reproductive/Obstetrics (+) Pregnancy                             Anesthesia Physical Anesthesia Plan  ASA: 2  Anesthesia Plan: Epidural   Post-op Pain Management:    Induction:   PONV Risk Score and Plan:   Airway Management Planned:   Additional Equipment:   Intra-op Plan:   Post-operative Plan:   Informed Consent: I have reviewed the patients History and Physical, chart, labs and discussed the procedure including the risks, benefits and alternatives for the proposed anesthesia with the patient or authorized representative who has indicated his/her understanding and acceptance.       Plan Discussed with:   Anesthesia Plan Comments: (39.5 wk Primagravida w hx of asthama for LEA)         Anesthesia Quick Evaluation

## 2022-06-21 NOTE — MAU Note (Signed)
...  Rebecca Noble is a 21 y.o. at [redacted]w[redacted]d here in MAU reporting: CTX every two minutes. Sent home from MAU earlier this morning. Was 3cm. Denies VB or LOF.   GBS-.  Pain score: 9/10 lower abdomen  Lab orders placed from triage:  MAU Labor

## 2022-06-21 NOTE — H&P (Signed)
   HPI: 21 y.o. G1P0 @ [redacted]w[redacted]d estimated gestational age Estimated Date of Delivery: 06/23/22 presents for spontaneous latent labor.  Leakage of fluid:  No Vaginal bleeding:  No Contractions:  Q2-3 mins Fetal movement:  Yes  Prenatal care has been provided by Dr Delora Fuel with [facility].   ROS:  Denies fevers, chills, chest pain, visual changes, SOB, RUQ/epigastric pain, N/V, dysuria, hematuria, or sudden onset/worsening bilateral LE or facial edema.  Pregnancy complicated by: Resolved fetal bilat ptyalectasis Eczema Asthma Anxiety/MDD no meds.    Prenatal Transfer Tool  Maternal Diabetes: No Genetic Screening: Normal Maternal Ultrasounds/Referrals: Normal Fetal Ultrasounds or other Referrals:  Referred to Materal Fetal Medicine , resolved fetal pyelectasis.  Maternal Substance Abuse:  No Significant Maternal Medications:  None Significant Maternal Lab Results: Group B Strep negative   Prenatal Labs Blood type:  B+ Antibody screen:  Negative CBC:  H/H 11.9 upon admission Rubella: Immune RPR:  Non-reactive Hep B:  Negative Hep C:  Negative HIV:  Negative GC/CT:  Negative Glucola:  WNL  Immunizations: Tdap: UTD Flu: UTD  OBHx:  OB History     Gravida  1   Para      Term      Preterm      AB      Living         SAB      IAB      Ectopic      Multiple      Live Births             PMHx:   Active Ambulatory Problems    Diagnosis Date Noted   No Active Ambulatory Problems   Resolved Ambulatory Problems    Diagnosis Date Noted   No Resolved Ambulatory Problems   Past Medical History:  Diagnosis Date   Asthma     Meds:  PNV  Allergy:   Allergies  Allergen Reactions   Banana    Milk-Related Compounds     "hives and SOB"    Peanut-Containing Drug Products     ALL NUTS   Shellfish Allergy    Soy Allergy    Tomato     "hives and SOB"   Wheat Bran     " hives and SOB"    SurgHx:  Past Surgical History:  Procedure  Laterality Date   NO PAST SURGERIES      SocHx:   NoTobacco, ETOH, illicit drugs  O: BP 706/23   Pulse (!) 109   Temp 98.7 F (37.1 C) (Oral)   Resp 20   LMP 09/16/2021   SpO2 100%  Gen. AAOx3, NAD CV.  RRR  Resp. CTAB, no wheezes/rales/rhonchi Abd. Gravid, soft, non-tender throughout, no rebound/guarding Extr.  No bilateral LE edema, no calf tenderness bilaterally SVE: 3.5/100/-1  SSE: No vulvar/vaginal/cervical lesions.   Last [place]:  31 weeks 1769gm, 7.62 83%, cephalic   FHT: 151 baseline, moderate variability, + accels,  - decels Toco: q 2.5-3.5 min   Labs: see orders  A/P:  21 y.o. G1P0 @ [redacted]w[redacted]d who presents for spontaneous latent labor.  - Admit to LD - Admit labs (CBC, T&S, COVID screen) - CEFM/Toco - FWB:  Cat 1 - Diet:  Regular - IVF:  125 NS/hr - VTE Prophylaxis:  SCDs - GBS Status:  Negative - Presentation:  vertex by sutures - Pain control:  epidural  [facility], CNM, FNP-C, PMHNP-BC [phone] (office) 06/21/2022, 7:00 PM

## 2022-06-22 DIAGNOSIS — D62 Acute posthemorrhagic anemia: Secondary | ICD-10-CM | POA: Diagnosis not present

## 2022-06-22 LAB — COMPREHENSIVE METABOLIC PANEL
ALT: 17 U/L (ref 0–44)
AST: 28 U/L (ref 15–41)
Albumin: 2.3 g/dL — ABNORMAL LOW (ref 3.5–5.0)
Alkaline Phosphatase: 91 U/L (ref 38–126)
Anion gap: 8 (ref 5–15)
BUN: 6 mg/dL (ref 6–20)
CO2: 21 mmol/L — ABNORMAL LOW (ref 22–32)
Calcium: 9.1 mg/dL (ref 8.9–10.3)
Chloride: 110 mmol/L (ref 98–111)
Creatinine, Ser: 0.46 mg/dL (ref 0.44–1.00)
GFR, Estimated: >60 mL/min (ref >60)
Glucose, Bld: 97 mg/dL (ref 70–99)
Potassium: 3.6 mmol/L (ref 3.5–5.1)
Sodium: 139 mmol/L (ref 135–145)
Total Bilirubin: 0.4 mg/dL (ref 0.3–1.2)
Total Protein: 5.3 g/dL — ABNORMAL LOW (ref 6.5–8.1)

## 2022-06-22 LAB — CBC
HCT: 26.7 % — ABNORMAL LOW (ref 36.0–46.0)
Hemoglobin: 9.3 g/dL — ABNORMAL LOW (ref 12.0–15.0)
MCH: 28.8 pg (ref 26.0–34.0)
MCHC: 34.8 g/dL (ref 30.0–36.0)
MCV: 82.7 fL (ref 80.0–100.0)
Platelets: 227 10*3/uL (ref 150–400)
RBC: 3.23 MIL/uL — ABNORMAL LOW (ref 3.87–5.11)
RDW: 12.4 % (ref 11.5–15.5)
WBC: 13.5 10*3/uL — ABNORMAL HIGH (ref 4.0–10.5)
nRBC: 0 % (ref 0.0–0.2)

## 2022-06-22 LAB — RPR: RPR Ser Ql: NONREACTIVE

## 2022-06-22 MED ORDER — IBUPROFEN 100 MG/5ML PO SUSP
600.0000 mg | Freq: Four times a day (QID) | ORAL | Status: DC
  Administered 2022-06-22 – 2022-06-23 (×4): 600 mg via ORAL
  Filled 2022-06-22 (×5): qty 30

## 2022-06-22 MED ORDER — ACETAMINOPHEN 160 MG/5ML PO SOLN: 650.0000 mg | ORAL | Status: DC | PRN

## 2022-06-22 MED ORDER — POLYSACCHARIDE IRON COMPLEX 150 MG PO CAPS
150.0000 mg | ORAL_CAPSULE | Freq: Every day | ORAL | Status: DC
  Administered 2022-06-22 – 2022-06-23 (×2): 150 mg via ORAL
  Filled 2022-06-22 (×2): qty 1

## 2022-06-22 NOTE — Anesthesia Postprocedure Evaluation (Signed)
Anesthesia Post Note  Patient: Rebecca Noble  Procedure(s) Performed: AN AD HOC LABOR EPIDURAL     Patient location during evaluation: Mother Baby Anesthesia Type: Epidural Level of consciousness: awake and alert Pain management: pain level controlled Vital Signs Assessment: post-procedure vital signs reviewed and stable Respiratory status: spontaneous breathing, nonlabored ventilation and respiratory function stable Cardiovascular status: stable Postop Assessment: no headache, no backache and epidural receding Anesthetic complications: no   No notable events documented.  Last Vitals:  Vitals:   06/22/22 0830 06/22/22 1200  BP: 119/79 117/64  Pulse: 75 87  Resp: 16 18  Temp: 36.8 C 36.9 C  SpO2: 100% 99%    Last Pain:  Vitals:   06/22/22 1200  TempSrc: Oral  PainSc: 3    Pain Goal: Patients Stated Pain Goal: 2 (06/22/22 1200)                 Rayvon Char

## 2022-06-22 NOTE — Progress Notes (Signed)
CSW received consult for hx of Anxiety and Depression. CSW met with Rebecca Noble to offer support and complete assessment. When CSW entered room, Rebecca Noble had visitors present. CSW introduced self and requested to speak with Rebecca Noble alone. Visitors left room. FOB remained in room, Rebecca Noble provided verbal consent to complete consult with FOB present. FOB and Rebecca Noble were observed holding and bonding with infant "Nyla-Monae." CSW explained reason for consult. Rebecca Noble presented as calm and remained engaged during encounter.  Rebecca Noble confirmed that she does "suffer from anxiety." Rebecca Noble reports since infant has been born, she has felt "okay" but reports anxiety during pregnancy, marked by "over thinking and then crying." Rebecca Noble shares she tends to hold her feelings in for a week and then share about her feelings and cry. CSW and Rebecca Noble discussed the importance of expressing feelings. FOB shared how he encourages Rebecca Noble to express herself, Rebecca Noble reported that she finds it helpful to talk to FOB about her worries. CSW commended Rebecca Noble and FOB for their open communication. Rebecca Noble identified living with her mother as her mother goes through a divorce as a trigger during her pregnancy. Rebecca Noble reports she and FOB moved in together 1 week ago and she feels her anxiety has decreased since moving. Rebecca Noble denies a history of depression. Rebecca Noble identified taking deep breaths and talking to FOB and her mother as healthy coping skills. Rebecca Noble recalls experiencing symptoms of anxiety since she was a little girl. Rebecca Noble reports she has never been formally diagnosed with anxiety but always includes anxiety on her problem list. Rebecca Noble reports she met with a therapist once during [facility] and another time in 2020 for about 2 months virtually. Rebecca Noble reports she has not taken psychotropic medication in the past. Rebecca Noble was agreeable to outpatient mental health resources and is open to therapy if needed in the future. CSW also provided Rebecca Noble with information about home visiting programs.   Rebecca Noble reports she has  all needed items for infant, including a car seat and bassinet. Rebecca Noble is still deciding on a pediatrician for follow up care, Rebecca Noble reports she has a list. Rebecca Noble receives [facility] and food stamps and knows to inform her case workers of infant's birth. Rebecca Noble reports she has a Marine scientist through her insurance who has been checking in on her during pregnancy. Rebecca Noble denied current SI/HI.   CSW provided education regarding the baby blues period vs. perinatal mood disorders, discussed treatment and gave resources for mental health follow up if concerns arise.  CSW recommends self-evaluation during the postpartum time period using the New Mom Checklist from Postpartum Progress and encouraged Rebecca Noble to contact a medical professional if symptoms are noted at any time.    CSW provided review of Sudden Infant Death Syndrome (SIDS) precautions.   CSW identifies no further need for intervention and no barriers to discharge at this time.  Signed,  Berniece Salines, MSW, [place] 06/22/2022 5:43 PM

## 2022-06-22 NOTE — Progress Notes (Signed)
CSW attempted to meet with MOB to complete consult for history of anxiety/depression. When CSW entered room, MOB was observed sleeping. CSW to follow up at a later time.   Signed,  Kinaya Hilliker K. Zyan Coby, MSW, LCSWA, LCASA 06/22/2022 1:40 PM   

## 2022-06-22 NOTE — Plan of Care (Signed)
  Problem: Education: Goal: Knowledge of condition will improve Outcome: Completed/Met

## 2022-06-22 NOTE — Progress Notes (Signed)
Patient requested that milk products and wheat bran be removed from her allergy list. Reactions include hives and shortness of breath. Patient states that she can have food cooked in milk, she just cannot drink milk. She also states that she can eat white bread. Dietary will not allow her to order multiple items from the menu due to these listed allergies; however, patient states that she knows that she can eat what she is ordering. Removed these allergies. Maxwell Caul, Leretha Dykes [place]

## 2022-06-23 ENCOUNTER — Ambulatory Visit: Payer: Self-pay

## 2022-06-23 MED ORDER — IBUPROFEN 100 MG/5ML PO SUSP: 600.0000 mg | Freq: Four times a day (QID) | ORAL | 0 refills | Status: AC

## 2022-06-23 MED ORDER — POLYSACCHARIDE IRON COMPLEX 150 MG PO CAPS: 150.0000 mg | ORAL_CAPSULE | Freq: Every day | ORAL | 0 refills | Status: AC

## 2022-06-23 NOTE — Lactation Note (Signed)
This note was copied from a baby's chart. Lactation Consultation Note  Patient Name: Rebecca Noble KGYJE'H Date: 06/23/2022 - Age 20 hours old  Reason for consult: Initial assessment;1st time breastfeeding;Primapara;Term;Infant weight loss (6 % weight loss, Dyad for D/C today. per mom latching is going well except on the Rt breast. LC assisted and changed the to Football position. Baby had recently fed, latched for few mins.) Latch score 8.  Per mom the baby had recently breast fed. Baby was fussy, diaper was checked, clean, and after fed 5 mins , released and fell asleep.  Mom mentioned the baby seems to like the left breast better than the right.  LC recommended prior to latch - breast massage,hand express, pre-pumped  with hand pump and reverse pressure ( LC demo to mom) to elongate the nipple / areola complex for a deeper latch.  LC stressed the importance of STS feedings until the baby is back to birth weight, gaining steadily and can stay awake for the majority of the feeding.  Feed with feeding cues, and 3 hours if baby isn't showing feeding cues to check diaper,and offer the breast.  ( Feed 8-12 times in 24 hours ).  LC reviewed BF D/C teaching and [place] resources  after D/C if needed.  Maternal Data Has patient been taught Hand Expression?: Yes  Feeding Mother's Current Feeding Choice: Breast Milk  LATCH Score Latch: Repeated attempts needed to sustain latch, nipple held in mouth throughout feeding, stimulation needed to elicit sucking reflex. (right breast)  Audible Swallowing: Spontaneous and intermittent  Type of Nipple: Everted at rest and after stimulation (areola edema/ LC showed  mom reverse pressure, and the nipple areola complex elongated.)  Comfort (Breast/Nipple): Soft / non-tender (breast fuller , milk coming in)  Hold (Positioning): Assistance needed to correctly position infant at breast and maintain latch.  LATCH Score: 8   Lactation Tools Discussed/Used     Interventions Interventions: Breast feeding basics reviewed;Assisted with latch;Breast massage;Hand express;Reverse pressure;Breast compression;Adjust position;Support pillows;Position options;Education;LC Services brochure  Discharge Discharge Education: Engorgement and breast care;Warning signs for feeding baby Pump: DEBP;Manual;Personal  Consult Status Consult Status: Complete Date: 06/23/22    Myer Haff 06/23/2022, 3:18 PM

## 2022-07-03 ENCOUNTER — Telehealth (HOSPITAL_COMMUNITY): Payer: Self-pay | Admitting: *Deleted

## 2022-07-03 NOTE — Telephone Encounter (Signed)
Voicemail not setup. Unable to leave message.  Odis Hollingshead, RN 07-03-2022 at 10:05am

## 2022-07-08 NOTE — Discharge Summary (Signed)
Postpartum Discharge Summary   Patient Name: Rebecca Noble DOB: 11/12/2000 MRN: [id number]  Date of admission: 06/21/2022 Delivery date:06/21/2022  Delivering provider: Noralyn Pick  Date of discharge: 07/08/2022  Admitting diagnosis: Normal labor and delivery [O80] Intrauterine pregnancy: [redacted]w[redacted]d     Secondary diagnosis:  Principal Problem:   Normal labor and delivery Active Problems:   SVD (spontaneous vaginal delivery)   Normal postpartum course   Gestational hypertension   Acute blood loss anemia    Discharge diagnosis: Term Pregnancy [facility] course: Onset of Labor With Vaginal Delivery      21 y.o. yo G1P1001 at [redacted]w[redacted]d was admitted in Active Labor on 06/21/2022. Membrane Rupture Time/Date: 7:16 PM ,06/21/2022   Delivery Method:Vaginal, Spontaneous  Episiotomy: None  Lacerations:  None  Patient had a postpartum course complicated by gestational HTN.  She is ambulating, tolerating a regular diet, passing flatus, and urinating well. Patient is discharged home in stable condition on 07/08/22.  Newborn Data: Birth date:06/21/2022  Birth time:9:11 PM  Gender:Female  Living status:Living  Apgars:8 ,9  Weight:3.32 kg   Magnesium Sulfate received: No  Physical exam  Vitals:   06/22/22 1200 06/22/22 2145 06/23/22 0605 06/23/22 0915  BP: 117/64 123/66 122/72 122/72  Pulse: 87 100 78 78  Resp: 18 18 18 18   Temp: 98.5 F (36.9 C) 98.8 F (37.1 C) 98.1 F (36.7 C) 98.1 F (36.7 C)  TempSrc: Oral Oral Oral Oral  SpO2: 99% 99% 100%   Weight:    74.9 kg  Height:    4\' 11"  (1.499 m)   General: alert, cooperative, and no distress Lochia: appropriate Uterine Fundus: firm Incision: N/A DVT Evaluation: No evidence of DVT seen on physical exam. No significant calf/ankle edema. Labs: Lab Results  Component Value Date   WBC 13.5 (H) 06/22/2022   HGB 9.3 (L) 06/22/2022   HCT 26.7 (L) 06/22/2022   MCV 82.7 06/22/2022    PLT 227 06/22/2022      Latest Ref Rng & Units 06/22/2022    4:25 AM  CMP  Glucose 70 - 99 mg/dL 97   BUN 6 - 20 mg/dL 6   Creatinine 0.44 - 1.00 mg/dL 0.46   Sodium 135 - 145 mmol/L 139   Potassium 3.5 - 5.1 mmol/L 3.6   Chloride 98 - 111 mmol/L 110   CO2 22 - 32 mmol/L 21   Calcium 8.9 - 10.3 mg/dL 9.1   Total Protein 6.5 - 8.1 g/dL 5.3   Total Bilirubin 0.3 - 1.2 mg/dL 0.4   Alkaline Phos 38 - 126 U/L 91   AST 15 - 41 U/L 28   ALT 0 - 44 U/L 17    Edinburgh Score:    06/22/2022    3:55 PM  Edinburgh Postnatal Depression Scale Screening Tool  I have been able to laugh and see the funny side of things. 0  I have looked forward with enjoyment to things. 0  I have blamed myself unnecessarily when things went wrong. 1  I have been anxious or worried for no good reason. 0  I have felt scared or panicky for no good reason. 0  Things have been getting on top of me. 1  I have been  so unhappy that I have had difficulty sleeping. 0  I have felt sad or miserable. 0  I have been so unhappy that I have been crying. 0  The thought of harming myself has occurred to me. 0  Edinburgh Postnatal Depression Scale Total 2     After visit meds:  Allergies as of 06/23/2022       Reactions   Banana    Peanut-containing Drug Products    ALL NUTS   Shellfish Allergy    Soy Allergy    Tomato    "hives and SOB"        Medication List     TAKE these medications    cyclobenzaprine 10 MG tablet Commonly known as: FLEXERIL Take 1 tablet (10 mg total) by mouth 2 (two) times daily as needed for muscle spasms.   iron polysaccharides 150 MG capsule Commonly known as: NIFEREX Take 1 capsule (150 mg total) by mouth daily for 42 doses.   prenatal multivitamin Tabs tablet Take 1 tablet by mouth daily at 12 noon.       ASK your doctor about these medications    ibuprofen 100 MG/5ML suspension Commonly known as: ADVIL Take 30 mLs (600 mg total) by mouth every 6 (six) hours for 30  doses. Ask about: Should I take this medication?       Discharge home in stable condition Infant Feeding: Breast Infant Disposition:home with mother Discharge instruction: per After Visit Summary and Postpartum booklet. Activity: Advance as tolerated. Pelvic rest for 6 weeks.  Diet: routine diet Future Appointments:No future appointments. Follow up Visit:  Follow-up Information     Steva Ready, DO. Schedule an appointment as soon as possible for a visit in 6 week(s).   Specialty: Obstetrics and Gynecology Contact information: [address] [place] [address] [phone]         Steva Ready, DO. Schedule an appointment as soon as possible for a visit in 1 week(s).   Specialty: Obstetrics and Gynecology Why: Mood check Contact information: [address] [place] [address] [phone]                Anticipated Birth Control:  OCPs  07/08/2022 Prescilla Sours, MD

## 2022-12-17 DIAGNOSIS — R300 Dysuria: Secondary | ICD-10-CM | POA: Diagnosis not present

## 2022-12-17 DIAGNOSIS — A09 Infectious gastroenteritis and colitis, unspecified: Secondary | ICD-10-CM | POA: Diagnosis not present

## 2023-01-17 ENCOUNTER — Other Ambulatory Visit: Payer: Self-pay

## 2023-01-17 ENCOUNTER — Encounter (HOSPITAL_COMMUNITY): Payer: Self-pay

## 2023-01-17 ENCOUNTER — Emergency Department (HOSPITAL_COMMUNITY)
Admission: EM | Admit: 2023-01-17 | Discharge: 2023-01-17 | Disposition: A | Discharge status: Home / Self Care | Payer: BC Managed Care – PPO | Attending: Emergency Medicine | Admitting: Emergency Medicine

## 2023-01-17 DIAGNOSIS — Z91010 Allergy to peanuts: Secondary | ICD-10-CM | POA: Diagnosis not present

## 2023-01-17 DIAGNOSIS — R6883 Chills (without fever): Secondary | ICD-10-CM | POA: Diagnosis not present

## 2023-01-17 DIAGNOSIS — R197 Diarrhea, unspecified: Secondary | ICD-10-CM | POA: Diagnosis not present

## 2023-01-17 DIAGNOSIS — J45909 Unspecified asthma, uncomplicated: Secondary | ICD-10-CM | POA: Insufficient documentation

## 2023-01-17 DIAGNOSIS — R1084 Generalized abdominal pain: Secondary | ICD-10-CM | POA: Insufficient documentation

## 2023-01-17 DIAGNOSIS — R112 Nausea with vomiting, unspecified: Secondary | ICD-10-CM | POA: Insufficient documentation

## 2023-01-17 LAB — CBC WITH DIFFERENTIAL/PLATELET
Abs Immature Granulocytes: 0.04 10*3/uL (ref 0.00–0.07)
Basophils Absolute: 0 10*3/uL (ref 0.0–0.1)
Basophils Relative: 0 %
Eosinophils Absolute: 0 10*3/uL (ref 0.0–0.5)
Eosinophils Relative: 0 %
HCT: 45.8 % (ref 36.0–46.0)
Hemoglobin: 15.8 g/dL — ABNORMAL HIGH (ref 12.0–15.0)
Immature Granulocytes: 0 %
Lymphocytes Relative: 3 %
Lymphs Abs: 0.3 10*3/uL — ABNORMAL LOW (ref 0.7–4.0)
MCH: 29.3 pg (ref 26.0–34.0)
MCHC: 34.5 g/dL (ref 30.0–36.0)
MCV: 85 fL (ref 80.0–100.0)
Monocytes Absolute: 0.3 10*3/uL (ref 0.1–1.0)
Monocytes Relative: 3 %
Neutro Abs: 9.9 10*3/uL — ABNORMAL HIGH (ref 1.7–7.7)
Neutrophils Relative %: 94 %
Platelets: 401 10*3/uL — ABNORMAL HIGH (ref 150–400)
RBC: 5.39 MIL/uL — ABNORMAL HIGH (ref 3.87–5.11)
RDW: 12.5 % (ref 11.5–15.5)
WBC: 10.5 10*3/uL (ref 4.0–10.5)
nRBC: 0 % (ref 0.0–0.2)

## 2023-01-17 LAB — COMPREHENSIVE METABOLIC PANEL
ALT: 25 U/L (ref 0–44)
AST: 31 U/L (ref 15–41)
Albumin: 4.7 g/dL (ref 3.5–5.0)
Alkaline Phosphatase: 119 U/L (ref 38–126)
Anion gap: 15 (ref 5–15)
BUN: 14 mg/dL (ref 6–20)
CO2: 18 mmol/L — ABNORMAL LOW (ref 22–32)
Calcium: 9.5 mg/dL (ref 8.9–10.3)
Chloride: 106 mmol/L (ref 98–111)
Creatinine, Ser: 0.63 mg/dL (ref 0.44–1.00)
GFR, Estimated: >60 mL/min (ref >60)
Glucose, Bld: 126 mg/dL — ABNORMAL HIGH (ref 70–99)
Potassium: 3.5 mmol/L (ref 3.5–5.1)
Sodium: 139 mmol/L (ref 135–145)
Total Bilirubin: 1 mg/dL (ref 0.3–1.2)
Total Protein: 8.9 g/dL — ABNORMAL HIGH (ref 6.5–8.1)

## 2023-01-17 LAB — LIPASE, BLOOD: Lipase: 22 U/L (ref 11–51)

## 2023-01-17 MED ORDER — ONDANSETRON 4 MG PO TBDP
4.0000 mg | ORAL_TABLET | Freq: Once | ORAL | Status: AC
  Administered 2023-01-17: 4 mg via ORAL
  Filled 2023-01-17: qty 1

## 2023-01-17 MED ORDER — ONDANSETRON HCL 4 MG PO TABS: 4.0000 mg | ORAL_TABLET | Freq: Three times a day (TID) | ORAL | 0 refills | Status: DC | PRN

## 2023-01-17 MED ORDER — ONDANSETRON HCL 4 MG/2ML IJ SOLN: 4.0000 mg | Freq: Once | INTRAMUSCULAR | Status: DC

## 2023-01-17 NOTE — ED Triage Notes (Signed)
Patient reports vomiting for the last two hours. Also endorses diarrhea.

## 2023-01-17 NOTE — ED Provider Notes (Signed)
[place] EMERGENCY DEPARTMENT AT [facility] Provider Note   CSN: [id number] Arrival date & time: 01/17/23  0539     History  Chief Complaint  Patient presents with   Emesis    Rebecca Noble is a 22 y.o. female.  Patient presents to the emergency department complaining of 5 hours of nausea and vomiting with diarrhea.  Patient also endorses generalized abdominal pain and chills.  Patient denies known fevers, vaginal discharge, urinary symptoms, chest pain, shortness of breath, headache.  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  Past medical history significant for asthma  HPI     Home Medications Prior to Admission medications   Medication Sig Start Date End Date Taking? Authorizing Provider  ondansetron (ZOFRAN) 4 MG tablet Take 1 tablet (4 mg total) by mouth every 8 (eight) hours as needed for nausea or vomiting. 01/17/23  Yes Darrick Grinder, PA-C  cyclobenzaprine (FLEXERIL) 10 MG tablet Take 1 tablet (10 mg total) by mouth 2 (two) times daily as needed for muscle spasms. 02/10/22   Calvert Cantor, CNM  iron polysaccharides (NIFEREX) 150 MG capsule Take 1 capsule (150 mg total) by mouth daily for 42 doses. 06/23/22 08/04/22  Hoover Browns, MD  Prenatal Vit-Fe Fumarate-FA (PRENATAL MULTIVITAMIN) TABS tablet Take 1 tablet by mouth daily at 12 noon.    [provider]      Allergies    Banana, Peanut-containing drug products, Shellfish allergy, Soy allergy, and Tomato    Review of Systems   Review of Systems  Physical Exam Updated Vital Signs BP 132/83 (BP Location: Right Arm)   Pulse 88   Temp 98.4 F (36.9 C) (Oral)   Resp 15   Ht 4\' 11"  (1.499 m)   Wt 55.8 kg   SpO2 99%   BMI 24.84 kg/m  Physical Exam Vitals and nursing note reviewed.  Constitutional:      General: She is not in acute distress.    Appearance: She is well-developed.  HENT:     Head: Normocephalic and atraumatic.   Eyes:     Conjunctiva/sclera: Conjunctivae normal.  Cardiovascular:     Rate and Rhythm: Normal rate and regular rhythm.     Heart sounds: No murmur heard. Pulmonary:     Effort: Pulmonary effort is normal. No respiratory distress.     Breath sounds: Normal breath sounds.  Abdominal:     Palpations: Abdomen is soft.     Tenderness: There is no abdominal tenderness.  Musculoskeletal:        General: No swelling.     Cervical back: Neck supple.  Skin:    General: Skin is warm and dry.     Capillary Refill: Capillary refill takes less than 2 seconds.  Neurological:     Mental Status: She is alert.  Psychiatric:        Mood and Affect: Mood normal.     ED Results / Procedures / Treatments   Labs (all labs ordered are listed, but only abnormal results are displayed) Labs Reviewed  CBC WITH DIFFERENTIAL/PLATELET - Abnormal; Notable for the following components:      Result Value   RBC 5.39 (*)    Hemoglobin 15.8 (*)    Platelets 401 (*)    Neutro Abs 9.9 (*)    Lymphs Abs 0.3 (*)    All other components within normal limits  COMPREHENSIVE METABOLIC PANEL - Abnormal; Notable for the following  components:   CO2 18 (*)    Glucose, Bld 126 (*)    Total Protein 8.9 (*)    All other components within normal limits  LIPASE, BLOOD    EKG None  Radiology No results found.  Procedures Procedures    Medications Ordered in ED Medications  ondansetron (ZOFRAN-ODT) disintegrating tablet 4 mg (4 mg Oral Given 01/17/23 1610)    ED Course/ Medical Decision Making/ A&P                             Medical Decision Making Amount and/or Complexity of Data Reviewed Labs: ordered.  Risk Prescription drug management.   This patient presents to the ED for concern of nausea, vomiting, diarrhea, abdominal pain, this involves an extensive number of treatment options, and is a complaint that carries with it a high risk of complications and morbidity.  The differential diagnosis  includes gastroenteritis, cholecystitis, appendicitis, and others   Co morbidities that complicate the patient evaluation  Asthma   Lab Tests:  I Ordered, and personally interpreted labs.  The pertinent results include:  Grossly unremarkable CBC, CMP, lipase   Imaging Studies ordered:  The patient has no significant abdominal tenderness.  Nonacute/nonsurgical abdomen.  No indication at this time for abdominal imaging   Problem List / ED Course / Critical interventions / Medication management   I ordered medication including Zofran for nausea/vomiting Reevaluation of the patient after these medicines showed that the patient improved I have reviewed the patients home medicines and have made adjustments as needed   Test / Admission - Considered:  Patient with symptoms consistent with viral gastroenteritis. No signs of acute abdomen. Patient feels better with Zofran ODT and has passed PO challenge. Plan to discharge home at this time with prescription for Zofran.          Final Clinical Impression(s) / ED Diagnoses Final diagnoses:  Nausea vomiting and diarrhea    Rx / DC Orders ED Discharge Orders          Ordered    ondansetron (ZOFRAN) 4 MG tablet  Every 8 hours PRN        01/17/23 0934              Darrick Grinder, PA-C 01/17/23 0935    Terald Sleeper, MD 01/18/23 [phone]

## 2023-01-17 NOTE — Discharge Instructions (Signed)
You were evaluated today for nausea, vomiting, and diarrhea. Your symptoms are consistent with a viral stomach illness. I have prescribed medication for nausea to be taken as directed. Follow up as needed with your primary care provider. If you develop any life threatening symptoms such as uncontrollable nausea or vomiting, return to the emergency department.

## 2023-01-17 NOTE — ED Notes (Signed)
Waiting on urine for testing

## 2023-05-29 DIAGNOSIS — Z3200 Encounter for pregnancy test, result unknown: Secondary | ICD-10-CM | POA: Diagnosis not present

## 2023-06-20 DIAGNOSIS — Z3490 Encounter for supervision of normal pregnancy, unspecified, unspecified trimester: Secondary | ICD-10-CM | POA: Diagnosis not present

## 2023-06-23 DIAGNOSIS — O26891 Other specified pregnancy related conditions, first trimester: Secondary | ICD-10-CM | POA: Diagnosis not present

## 2023-06-23 DIAGNOSIS — O09291 Supervision of pregnancy with other poor reproductive or obstetric history, first trimester: Secondary | ICD-10-CM | POA: Diagnosis not present

## 2023-06-25 DIAGNOSIS — N898 Other specified noninflammatory disorders of vagina: Secondary | ICD-10-CM | POA: Diagnosis not present

## 2023-06-25 DIAGNOSIS — Z3481 Encounter for supervision of other normal pregnancy, first trimester: Secondary | ICD-10-CM | POA: Diagnosis not present

## 2023-06-25 DIAGNOSIS — Z124 Encounter for screening for malignant neoplasm of cervix: Secondary | ICD-10-CM | POA: Diagnosis not present

## 2023-06-25 LAB — OB RESULTS CONSOLE HIV ANTIBODY (ROUTINE TESTING): HIV: NONREACTIVE

## 2023-06-25 LAB — OB RESULTS CONSOLE GC/CHLAMYDIA
Chlamydia: NEGATIVE
Neisseria Gonorrhea: NEGATIVE

## 2023-06-25 LAB — OB RESULTS CONSOLE HEPATITIS B SURFACE ANTIGEN: Hepatitis B Surface Ag: NEGATIVE

## 2023-06-25 LAB — HEPATITIS C ANTIBODY: HCV Ab: NEGATIVE

## 2023-06-25 LAB — OB RESULTS CONSOLE RUBELLA ANTIBODY, IGM: Rubella: IMMUNE

## 2023-06-25 LAB — OB RESULTS CONSOLE RPR: RPR: NONREACTIVE

## 2023-07-03 DIAGNOSIS — Z3481 Encounter for supervision of other normal pregnancy, first trimester: Secondary | ICD-10-CM | POA: Diagnosis not present

## 2023-07-22 DIAGNOSIS — O368310 Maternal care for abnormalities of the fetal heart rate or rhythm, first trimester, not applicable or unspecified: Secondary | ICD-10-CM | POA: Diagnosis not present

## 2023-08-20 DIAGNOSIS — Z3481 Encounter for supervision of other normal pregnancy, first trimester: Secondary | ICD-10-CM | POA: Diagnosis not present

## 2023-08-20 DIAGNOSIS — Z3490 Encounter for supervision of normal pregnancy, unspecified, unspecified trimester: Secondary | ICD-10-CM | POA: Diagnosis not present

## 2023-09-08 DIAGNOSIS — Z36 Encounter for antenatal screening for chromosomal anomalies: Secondary | ICD-10-CM | POA: Diagnosis not present

## 2023-09-17 NOTE — L&D Delivery Note (Signed)
 Delivery Note At 2:54 AM a viable female was delivered via Vaginal, Spontaneous (Presentation: Right Occiput Anterior).  APGAR: 10, 10; weight  .   Placenta status: Spontaneous, Intact.  Cord: 3 vessels with the following complications: None.  Cord pH:   Anesthesia: Epidural Episiotomy: None Lacerations:   Suture Repair:   Est. Blood Loss (mL):  150cc  Mom to postpartum.  Baby to Couplet care / Skin to Skin.  Sheralee Qazi A Ambermarie Honeyman 01/28/2024, 3:22 AM

## 2023-10-12 ENCOUNTER — Inpatient Hospital Stay (HOSPITAL_COMMUNITY)
Admission: AD | Admit: 2023-10-12 | Discharge: 2023-10-12 | Disposition: A | Discharge status: Home / Self Care | Payer: BC Managed Care – PPO | Attending: Family Medicine | Admitting: Family Medicine

## 2023-10-12 ENCOUNTER — Other Ambulatory Visit: Payer: Self-pay

## 2023-10-12 ENCOUNTER — Encounter (HOSPITAL_COMMUNITY): Payer: Self-pay | Admitting: *Deleted

## 2023-10-12 DIAGNOSIS — E860 Dehydration: Secondary | ICD-10-CM | POA: Insufficient documentation

## 2023-10-12 DIAGNOSIS — R0602 Shortness of breath: Secondary | ICD-10-CM | POA: Diagnosis not present

## 2023-10-12 DIAGNOSIS — O99282 Endocrine, nutritional and metabolic diseases complicating pregnancy, second trimester: Secondary | ICD-10-CM | POA: Insufficient documentation

## 2023-10-12 DIAGNOSIS — O26892 Other specified pregnancy related conditions, second trimester: Secondary | ICD-10-CM | POA: Diagnosis not present

## 2023-10-12 DIAGNOSIS — Z3A24 24 weeks gestation of pregnancy: Secondary | ICD-10-CM | POA: Diagnosis not present

## 2023-10-12 DIAGNOSIS — M5459 Other low back pain: Secondary | ICD-10-CM | POA: Diagnosis not present

## 2023-10-12 DIAGNOSIS — R6883 Chills (without fever): Secondary | ICD-10-CM | POA: Insufficient documentation

## 2023-10-12 DIAGNOSIS — R519 Headache, unspecified: Secondary | ICD-10-CM | POA: Diagnosis not present

## 2023-10-12 LAB — URINALYSIS, ROUTINE W REFLEX MICROSCOPIC
Bilirubin Urine: NEGATIVE
Glucose, UA: NEGATIVE mg/dL
Hgb urine dipstick: NEGATIVE
Ketones, ur: 80 mg/dL — AB
Nitrite: NEGATIVE
Protein, ur: 30 mg/dL — AB
Specific Gravity, Urine: 1.025 (ref 1.005–1.030)
pH: 5 (ref 5.0–8.0)

## 2023-10-12 LAB — COMPREHENSIVE METABOLIC PANEL
ALT: 17 U/L (ref 0–44)
AST: 28 U/L (ref 15–41)
Albumin: 3.2 g/dL — ABNORMAL LOW (ref 3.5–5.0)
Alkaline Phosphatase: 74 U/L (ref 38–126)
Anion gap: 11 (ref 5–15)
BUN: <5 mg/dL — ABNORMAL LOW (ref 6–20)
CO2: 19 mmol/L — ABNORMAL LOW (ref 22–32)
Calcium: 9.2 mg/dL (ref 8.9–10.3)
Chloride: 106 mmol/L (ref 98–111)
Creatinine, Ser: 0.4 mg/dL — ABNORMAL LOW (ref 0.44–1.00)
GFR, Estimated: >60 mL/min (ref >60)
Glucose, Bld: 91 mg/dL (ref 70–99)
Potassium: 3.6 mmol/L (ref 3.5–5.1)
Sodium: 136 mmol/L (ref 135–145)
Total Bilirubin: 0.6 mg/dL (ref 0.0–1.2)
Total Protein: 7.3 g/dL (ref 6.5–8.1)

## 2023-10-12 LAB — RESPIRATORY PANEL BY PCR

## 2023-10-12 MED ORDER — BUTALBITAL-APAP-CAFFEINE 50-325-40 MG PO TABS
2.0000 | ORAL_TABLET | Freq: Once | ORAL | Status: AC
  Administered 2023-10-12: 2 via ORAL
  Filled 2023-10-12: qty 2

## 2023-10-12 MED ORDER — LACTATED RINGERS IV BOLUS
1000.0000 mL | Freq: Once | INTRAVENOUS | Status: AC
  Administered 2023-10-12: 1000 mL via INTRAVENOUS

## 2023-10-12 MED ORDER — ONDANSETRON HCL 4 MG PO TABS: 4.0000 mg | ORAL_TABLET | Freq: Three times a day (TID) | ORAL | 0 refills | Status: DC | PRN

## 2023-10-12 NOTE — MAU Provider Note (Signed)
History     CSN: [id number]  Arrival date and time: 10/12/23 1511   Event Date/Time   First Provider Initiated Contact with Patient 10/12/23 1636      Chief Complaint  Patient presents with   Headache   Back Pain   SOB   HPI  Rebecca Noble is a 23 y.o. female G2P1001 @ [redacted]w[redacted]d here in MAU with complaints of headache, chills, and lower back pain.   The symptoms started Within in the last 24 hours  She Did not check her temperature at home, but feels like she has a fever. Cannot think of anything she has been exposed too, but does work at a daycare.   She reports decreased appetite, and was feeling like she hasn't been eating much today. She took one bit of a chicken biscuit but nothing else.  Reports HA since today. Reports off and on headaches for the last 2 weeks. She took 1000 mg of tylenol which did not help her HA at all. She reports 6/10 HA pain.   OB History     Gravida  2   Para  1   Term  1   Preterm      AB      Living  1      SAB      IAB      Ectopic      Multiple  0   Live Births  1           Past Medical History:  Diagnosis Date   Asthma    last used rescue inhaler a few weeks ago    Past Surgical History:  Procedure Laterality Date   NO PAST SURGERIES      History reviewed. No pertinent family history.  Social History   Tobacco Use   Smoking status: Never   Smokeless tobacco: Never  Vaping Use   Vaping status: Never Used  Substance Use Topics   Alcohol use: Not Currently   Drug use: Not Currently    Comment: 09/17/2018    Allergies:  Allergies  Allergen Reactions   Banana    Peanut-Containing Drug Products     ALL NUTS   Shellfish Allergy    Soy Allergy (Do Not Select)    Tomato     "hives and SOB"    Medications Prior to Admission  Medication Sig Dispense Refill Last Dose/Taking   Prenatal Vit-Fe Fumarate-FA (PRENATAL MULTIVITAMIN) TABS tablet Take 1 tablet by mouth daily at 12 noon.   10/11/2023    cyclobenzaprine (FLEXERIL) 10 MG tablet Take 1 tablet (10 mg total) by mouth 2 (two) times daily as needed for muscle spasms. 20 tablet 0 More than a month   iron polysaccharides (NIFEREX) 150 MG capsule Take 1 capsule (150 mg total) by mouth daily for 42 doses. 42 capsule 0    ondansetron (ZOFRAN) 4 MG tablet Take 1 tablet (4 mg total) by mouth every 8 (eight) hours as needed for nausea or vomiting. 20 tablet 0 More than a month   Results for orders placed or performed during the hospital encounter of 10/12/23 (from the past 48 hours)  Urinalysis, Routine w reflex microscopic -Urine, Clean Catch     Status: Abnormal   Collection Time: 10/12/23  3:41 PM  Result Value Ref Range   Color, Urine YELLOW YELLOW   APPearance HAZY (A) CLEAR   Specific Gravity, Urine 1.025 1.005 - 1.030   pH 5.0 5.0 - 8.0   Glucose, UA  NEGATIVE NEGATIVE mg/dL   Hgb urine dipstick NEGATIVE NEGATIVE   Bilirubin Urine NEGATIVE NEGATIVE   Ketones, ur 80 (A) NEGATIVE mg/dL   Protein, ur 30 (A) NEGATIVE mg/dL   Nitrite NEGATIVE NEGATIVE   Leukocytes,Ua TRACE (A) NEGATIVE   RBC / HPF 0-5 0 - 5 RBC/hpf   WBC, UA 0-5 0 - 5 WBC/hpf   Bacteria, UA FEW (A) NONE SEEN   Squamous Epithelial / HPF 6-10 0 - 5 /HPF   Mucus PRESENT     Comment: Performed at [facility], [address] [place], [place] [address]  Comprehensive metabolic panel     Status: Abnormal   Collection Time: 10/12/23  5:16 PM  Result Value Ref Range   Sodium 136 135 - 145 mmol/L   Potassium 3.6 3.5 - 5.1 mmol/L   Chloride 106 98 - 111 mmol/L   CO2 19 (L) 22 - 32 mmol/L   Glucose, Bld 91 70 - 99 mg/dL    Comment: Glucose reference range applies only to samples taken after fasting for at least 8 hours.   BUN <5 (L) 6 - 20 mg/dL   Creatinine, Ser 6.44 (L) 0.44 - 1.00 mg/dL   Calcium 9.2 8.9 - 03.4 mg/dL   Total Protein 7.3 6.5 - 8.1 g/dL   Albumin 3.2 (L) 3.5 - 5.0 g/dL   AST 28 15 - 41 U/L   ALT 17 0 - 44 U/L   Alkaline Phosphatase 74 38 -  126 U/L   Total Bilirubin 0.6 0.0 - 1.2 mg/dL   GFR, Estimated >74 >25 mL/min    Comment: (NOTE) Calculated using the CKD-EPI Creatinine Equation (2021)    Anion gap 11 5 - 15    Comment: Performed at [facility], [address], [place], [place] [address]     Review of Systems  Constitutional:  Positive for chills.  Gastrointestinal:  Positive for diarrhea and nausea. Negative for vomiting.  Genitourinary:  Negative for difficulty urinating, dysuria, flank pain, frequency, hematuria and urgency.  Neurological:  Positive for headaches.   Physical Exam   Blood pressure 123/69, pulse (!) 109, temperature 98.4 F (36.9 C), temperature source Oral, resp. rate 18, height 4\' 11"  (1.499 m), weight 61 kg, SpO2 100%, unknown if currently breastfeeding.  Physical Exam Constitutional:      General: She is not in acute distress.    Appearance: She is well-developed. She is not ill-appearing, toxic-appearing or diaphoretic.  HENT:     Head: Normocephalic.  Pulmonary:     Effort: Pulmonary effort is normal.     Breath sounds: Normal breath sounds.  Skin:    General: Skin is warm.  Neurological:     General: No focal deficit present.     Mental Status: She is alert and oriented to person, place, and time.     GCS: GCS eye subscore is 4. GCS verbal subscore is 5. GCS motor subscore is 6.  Psychiatric:        Behavior: Behavior normal.    Fetal Tracing Baseline: 135 bpm Variability: Moderate  Accelerations: 15x15 Decelerations: None Toco: None   MAU Course  Procedures  MDM  Urine shows >80 ketones LR bolus x 2 Fioricet x 2 tablets, pain 0/10 patient tolerating oral fluids and crackers. Feels much better. Respiratory panel pending.   Assessment and Plan   A:  1. Pregnancy headache in second trimester   2. Severe dehydration   3. Chills  P:  Dc home Encouraged fluids  Rx: Zofran Will call if respiratory swab positive Return to MAU if symptoms  worsen Urine culture pending  Duane Lope, NP 10/12/2023 7:38 PM

## 2023-10-12 NOTE — MAU Note (Signed)
Mistie Adney is a 23 y.o. at [redacted]w[redacted]d here in MAU reporting: she has a HA, SOB, lower back &bilateral  hip pain that began yesterday.  Reports took Tylenol @ 1130, no relief noted.  Reports back and bilateral hip pain is constant, sharp and aching. States has taken a home Covid test.  Denies VB or LOF.  Endorses +FM, less than usual.  LMP: NA Onset of complaint: yesterday Pain score: 8 HA & 10 hips and back Vitals:   10/12/23 1533  BP: 117/67  Pulse: (!) 111  Resp: 19  Temp: 98.8 F (37.1 C)  SpO2: 100%     FHT: 155 bpm  Lab orders placed from triage: UA

## 2023-10-12 NOTE — MAU Note (Signed)
This RN called Micro lab to ensure OB urine culture can be added on to urine already in lab. Per lab tech will add on.

## 2023-10-13 LAB — CULTURE, OB URINE

## 2023-10-17 DIAGNOSIS — Z3490 Encounter for supervision of normal pregnancy, unspecified, unspecified trimester: Secondary | ICD-10-CM | POA: Diagnosis not present

## 2023-10-17 DIAGNOSIS — Z3482 Encounter for supervision of other normal pregnancy, second trimester: Secondary | ICD-10-CM | POA: Diagnosis not present

## 2023-11-17 DIAGNOSIS — Z23 Encounter for immunization: Secondary | ICD-10-CM | POA: Diagnosis not present

## 2023-12-16 DIAGNOSIS — L2089 Other atopic dermatitis: Secondary | ICD-10-CM | POA: Diagnosis not present

## 2023-12-24 LAB — OB RESULTS CONSOLE GBS: GBS: NEGATIVE

## 2023-12-29 DIAGNOSIS — Z3493 Encounter for supervision of normal pregnancy, unspecified, third trimester: Secondary | ICD-10-CM | POA: Diagnosis not present

## 2024-01-08 DIAGNOSIS — B3731 Acute candidiasis of vulva and vagina: Secondary | ICD-10-CM | POA: Diagnosis not present

## 2024-01-08 DIAGNOSIS — Z3493 Encounter for supervision of normal pregnancy, unspecified, third trimester: Secondary | ICD-10-CM | POA: Diagnosis not present

## 2024-01-27 ENCOUNTER — Encounter (HOSPITAL_COMMUNITY): Payer: Self-pay | Admitting: Obstetrics and Gynecology

## 2024-01-27 ENCOUNTER — Inpatient Hospital Stay (HOSPITAL_COMMUNITY)
Admission: AD | Admit: 2024-01-27 | Discharge: 2024-01-29 | DRG: 807 | Disposition: A | Discharge status: Home / Self Care | Attending: Obstetrics and Gynecology | Admitting: Obstetrics and Gynecology

## 2024-01-27 DIAGNOSIS — O9902 Anemia complicating childbirth: Secondary | ICD-10-CM | POA: Diagnosis not present

## 2024-01-27 DIAGNOSIS — O4292 Full-term premature rupture of membranes, unspecified as to length of time between rupture and onset of labor: Secondary | ICD-10-CM | POA: Diagnosis not present

## 2024-01-27 DIAGNOSIS — Z3A39 39 weeks gestation of pregnancy: Secondary | ICD-10-CM

## 2024-01-27 DIAGNOSIS — O26893 Other specified pregnancy related conditions, third trimester: Secondary | ICD-10-CM | POA: Diagnosis not present

## 2024-01-27 DIAGNOSIS — O4290 Premature rupture of membranes, unspecified as to length of time between rupture and onset of labor, unspecified weeks of gestation: Principal | ICD-10-CM | POA: Diagnosis present

## 2024-01-27 LAB — POCT FERN TEST: POCT Fern Test: POSITIVE

## 2024-01-27 MED ORDER — FENTANYL CITRATE (PF) 100 MCG/2ML IJ SOLN: 100.0000 ug | INTRAMUSCULAR | Status: DC | PRN

## 2024-01-27 NOTE — H&P (Addendum)
 Rebecca Noble is a 23 y.o. female presenting for labor and SROM.  CLEAR FLUID .  Rebecca Noble OB History     Gravida  2   Para  1   Term  1   Preterm      AB      Living  1      SAB      IAB      Ectopic      Multiple  0   Live Births  1          Past Medical History:  Diagnosis Date   Asthma    last used rescue inhaler a few weeks ago   Past Surgical History:  Procedure Laterality Date   NO PAST SURGERIES     Family History: family history is not on file. Social History:  reports that she has never smoked. She has never used smokeless tobacco. She reports that she does not currently use alcohol. She reports that she does not currently use drugs after having used the following drugs: Marijuana.     Maternal Diabetes: No Genetic Screening: Normal Maternal Ultrasounds/Referrals: Normal Fetal Ultrasounds or other Referrals:  None Maternal Substance Abuse:  No Significant Maternal Medications:  None Significant Maternal Lab Results:  Group B Strep negative Number of Prenatal Visits:greater than 3 verified prenatal visits Maternal Vaccinations: Other Comments:  None  Review of Systems History Dilation: 4.5 Effacement (%): 80 Station: -2 Exam by:: [employer], RN Blood pressure (!) 143/86, pulse 86, temperature 98.3 F (36.8 C), resp. rate 17, height 4\' 11"  (1.499 m), weight 72.6 kg, SpO2 100%, unknown if currently breastfeeding. Exam Physical Exam  Physical Examination: General appearance - alert, well appearing, and in no distress Pelvic - see RN exam  Prenatal labs: ABO, Rh:   Antibody:   Rubella:   RPR:    HBsAg:    HIV:    GBS:     Assessment/Plan: SROM AT TERM  MAY HAVE EPIDURAL  ANTICIPATE SVD PT IS AN [facility]AG[facility] PT  CAT 1   Rebecca Noble A Amias Hutchinson 01/27/2024, 11:59 PM

## 2024-01-27 NOTE — MAU Note (Signed)
 Rebecca Noble is a 23 y.o. at [redacted]w[redacted]d here in MAU reporting SROM at 2230 with clear fld. Having irreg ctxs. Was in office today and had membrane sweep. 4cm dilated at office visit. Reports good FM and denies VB.   LMP: n/a  Onset of complaint: 2230 Pain score: 8 Vitals:   01/27/24 2257  BP: (!) 144/75  Pulse: 93  Resp: 17  Temp: 98.3 F (36.8 C)  SpO2: 100%     FHT: 145  Lab orders placed from triage: labor eval

## 2024-01-28 ENCOUNTER — Other Ambulatory Visit: Payer: Self-pay

## 2024-01-28 ENCOUNTER — Inpatient Hospital Stay (HOSPITAL_COMMUNITY): Admitting: Anesthesiology

## 2024-01-28 ENCOUNTER — Encounter (HOSPITAL_COMMUNITY): Payer: Self-pay | Admitting: Obstetrics and Gynecology

## 2024-01-28 ENCOUNTER — Other Ambulatory Visit (HOSPITAL_COMMUNITY): Payer: Self-pay

## 2024-01-28 DIAGNOSIS — O4290 Premature rupture of membranes, unspecified as to length of time between rupture and onset of labor, unspecified weeks of gestation: Principal | ICD-10-CM | POA: Diagnosis present

## 2024-01-28 DIAGNOSIS — O9902 Anemia complicating childbirth: Secondary | ICD-10-CM | POA: Diagnosis present

## 2024-01-28 DIAGNOSIS — O26893 Other specified pregnancy related conditions, third trimester: Secondary | ICD-10-CM | POA: Diagnosis present

## 2024-01-28 DIAGNOSIS — O4292 Full-term premature rupture of membranes, unspecified as to length of time between rupture and onset of labor: Secondary | ICD-10-CM | POA: Diagnosis present

## 2024-01-28 DIAGNOSIS — Z3A39 39 weeks gestation of pregnancy: Secondary | ICD-10-CM | POA: Diagnosis not present

## 2024-01-28 LAB — COMPREHENSIVE METABOLIC PANEL WITH GFR
ALT: 15 U/L (ref 0–44)
AST: 22 U/L (ref 15–41)
Albumin: 3.1 g/dL — ABNORMAL LOW (ref 3.5–5.0)
Alkaline Phosphatase: 109 U/L (ref 38–126)
Anion gap: 12 (ref 5–15)
BUN: <5 mg/dL — ABNORMAL LOW (ref 6–20)
CO2: 18 mmol/L — ABNORMAL LOW (ref 22–32)
Calcium: 9.2 mg/dL (ref 8.9–10.3)
Chloride: 108 mmol/L (ref 98–111)
Creatinine, Ser: 0.4 mg/dL — ABNORMAL LOW (ref 0.44–1.00)
GFR, Estimated: >60 mL/min (ref >60)
Glucose, Bld: 95 mg/dL (ref 70–99)
Potassium: 3 mmol/L — ABNORMAL LOW (ref 3.5–5.1)
Sodium: 138 mmol/L (ref 135–145)
Total Bilirubin: 0.8 mg/dL (ref 0.0–1.2)
Total Protein: 7 g/dL (ref 6.5–8.1)

## 2024-01-28 LAB — CBC
HCT: 29.5 % — ABNORMAL LOW (ref 36.0–46.0)
HCT: 34.8 % — ABNORMAL LOW (ref 36.0–46.0)
Hemoglobin: 10 g/dL — ABNORMAL LOW (ref 12.0–15.0)
Hemoglobin: 11.6 g/dL — ABNORMAL LOW (ref 12.0–15.0)
MCH: 27.7 pg (ref 26.0–34.0)
MCH: 27.8 pg (ref 26.0–34.0)
MCHC: 33.3 g/dL (ref 30.0–36.0)
MCHC: 33.9 g/dL (ref 30.0–36.0)
MCV: 81.9 fL (ref 80.0–100.0)
MCV: 83.1 fL (ref 80.0–100.0)
Platelets: 264 10*3/uL (ref 150–400)
Platelets: 316 10*3/uL (ref 150–400)
RBC: 3.6 MIL/uL — ABNORMAL LOW (ref 3.87–5.11)
RBC: 4.19 MIL/uL (ref 3.87–5.11)
RDW: 12.4 % (ref 11.5–15.5)
RDW: 12.4 % (ref 11.5–15.5)
WBC: 11.9 10*3/uL — ABNORMAL HIGH (ref 4.0–10.5)
WBC: 16.2 10*3/uL — ABNORMAL HIGH (ref 4.0–10.5)
nRBC: 0 % (ref 0.0–0.2)
nRBC: 0 % (ref 0.0–0.2)

## 2024-01-28 LAB — PROTEIN / CREATININE RATIO, URINE
Creatinine, Urine: 156 mg/dL
Protein Creatinine Ratio: 0.09 mg/mg{creat} (ref 0.00–0.15)
Total Protein, Urine: 14 mg/dL

## 2024-01-28 LAB — RPR: RPR Ser Ql: NONREACTIVE

## 2024-01-28 LAB — TYPE AND SCREEN

## 2024-01-28 MED ORDER — FENTANYL-BUPIVACAINE-NACL 0.5-0.125-0.9 MG/250ML-% EP SOLN
12.0000 mL/h | EPIDURAL | Status: DC | PRN
  Filled 2024-01-28: qty 250

## 2024-01-28 MED ORDER — IBUPROFEN 600 MG PO TABS
600.0000 mg | ORAL_TABLET | Freq: Four times a day (QID) | ORAL | Status: DC
  Administered 2024-01-28 – 2024-01-29 (×6): 600 mg via ORAL
  Filled 2024-01-28 (×6): qty 1

## 2024-01-28 MED ORDER — SENNOSIDES-DOCUSATE SODIUM 8.6-50 MG PO TABS
2.0000 | ORAL_TABLET | Freq: Every day | ORAL | Status: DC
  Administered 2024-01-29: 2 via ORAL
  Filled 2024-01-28: qty 2

## 2024-01-28 MED ORDER — WITCH HAZEL-GLYCERIN EX PADS: 1.0000 | MEDICATED_PAD | CUTANEOUS | Status: DC | PRN

## 2024-01-28 MED ORDER — ZOLPIDEM TARTRATE 5 MG PO TABS: 5.0000 mg | ORAL_TABLET | Freq: Every evening | ORAL | Status: DC | PRN

## 2024-01-28 MED ORDER — LACTATED RINGERS IV SOLN: INTRAVENOUS | Status: DC

## 2024-01-28 MED ORDER — SOD CITRATE-CITRIC ACID 500-334 MG/5ML PO SOLN: 30.0000 mL | ORAL | Status: DC | PRN

## 2024-01-28 MED ORDER — ONDANSETRON HCL 4 MG/2ML IJ SOLN: 4.0000 mg | Freq: Four times a day (QID) | INTRAMUSCULAR | Status: DC | PRN

## 2024-01-28 MED ORDER — OXYCODONE-ACETAMINOPHEN 5-325 MG PO TABS: 2.0000 | ORAL_TABLET | ORAL | Status: DC | PRN

## 2024-01-28 MED ORDER — ONDANSETRON HCL 4 MG PO TABS: 4.0000 mg | ORAL_TABLET | ORAL | Status: DC | PRN

## 2024-01-28 MED ORDER — PHENYLEPHRINE 80 MCG/ML (10ML) SYRINGE FOR IV PUSH (FOR BLOOD PRESSURE SUPPORT): 80.0000 ug | PREFILLED_SYRINGE | INTRAVENOUS | Status: DC | PRN

## 2024-01-28 MED ORDER — OXYTOCIN BOLUS FROM INFUSION
333.0000 mL | Freq: Once | INTRAVENOUS | Status: AC
  Administered 2024-01-28: 333 mL via INTRAVENOUS

## 2024-01-28 MED ORDER — DIPHENHYDRAMINE HCL 50 MG/ML IJ SOLN: 12.5000 mg | INTRAMUSCULAR | Status: DC | PRN

## 2024-01-28 MED ORDER — DIPHENHYDRAMINE HCL 25 MG PO CAPS: 25.0000 mg | ORAL_CAPSULE | Freq: Four times a day (QID) | ORAL | Status: DC | PRN

## 2024-01-28 MED ORDER — SIMETHICONE 80 MG PO CHEW: 80.0000 mg | CHEWABLE_TABLET | ORAL | Status: DC | PRN

## 2024-01-28 MED ORDER — FENTANYL-BUPIVACAINE-NACL 0.5-0.125-0.9 MG/250ML-% EP SOLN: 12.0000 mL/h | EPIDURAL | Status: DC | PRN

## 2024-01-28 MED ORDER — COCONUT OIL OIL: 1.0000 | TOPICAL_OIL | Status: DC | PRN

## 2024-01-28 MED ORDER — OXYTOCIN-SODIUM CHLORIDE 30-0.9 UT/500ML-% IV SOLN
2.5000 [IU]/h | INTRAVENOUS | Status: DC
  Administered 2024-01-28: 2.5 [IU]/h via INTRAVENOUS
  Filled 2024-01-28: qty 500

## 2024-01-28 MED ORDER — ONDANSETRON HCL 4 MG/2ML IJ SOLN: 4.0000 mg | INTRAMUSCULAR | Status: DC | PRN

## 2024-01-28 MED ORDER — POLYSACCHARIDE IRON COMPLEX 150 MG PO CAPS
150.0000 mg | ORAL_CAPSULE | Freq: Every day | ORAL | Status: DC
  Administered 2024-01-28 – 2024-01-29 (×2): 150 mg via ORAL
  Filled 2024-01-28 (×2): qty 1

## 2024-01-28 MED ORDER — ACETAMINOPHEN 325 MG PO TABS: 650.0000 mg | ORAL_TABLET | ORAL | Status: DC | PRN

## 2024-01-28 MED ORDER — LACTATED RINGERS IV SOLN
500.0000 mL | Freq: Once | INTRAVENOUS | Status: AC
  Administered 2024-01-28: 1000 mL via INTRAVENOUS

## 2024-01-28 MED ORDER — IBUPROFEN 600 MG PO TABS
600.0000 mg | ORAL_TABLET | Freq: Four times a day (QID) | ORAL | 0 refills | Status: AC
  Filled 2024-01-28: qty 30, 8d supply, fill #0

## 2024-01-28 MED ORDER — TETANUS-DIPHTH-ACELL PERTUSSIS 5-2.5-18.5 LF-MCG/0.5 IM SUSY: 0.5000 mL | PREFILLED_SYRINGE | Freq: Once | INTRAMUSCULAR | Status: DC

## 2024-01-28 MED ORDER — EPHEDRINE 5 MG/ML INJ: 10.0000 mg | INTRAVENOUS | Status: DC | PRN

## 2024-01-28 MED ORDER — LIDOCAINE HCL (PF) 1 % IJ SOLN
INTRAMUSCULAR | Status: DC | PRN
  Administered 2024-01-28: 8 mL via EPIDURAL

## 2024-01-28 MED ORDER — LACTATED RINGERS IV SOLN: 500.0000 mL | INTRAVENOUS | Status: DC | PRN

## 2024-01-28 MED ORDER — OXYCODONE-ACETAMINOPHEN 5-325 MG PO TABS: 1.0000 | ORAL_TABLET | ORAL | Status: DC | PRN

## 2024-01-28 MED ORDER — PRENATAL MULTIVITAMIN CH
1.0000 | ORAL_TABLET | Freq: Every day | ORAL | Status: DC
  Administered 2024-01-28 – 2024-01-29 (×2): 1 via ORAL
  Filled 2024-01-28 (×2): qty 1

## 2024-01-28 MED ORDER — LACTATED RINGERS IV SOLN: 500.0000 mL | Freq: Once | INTRAVENOUS | Status: DC

## 2024-01-28 MED ORDER — LIDOCAINE HCL (PF) 1 % IJ SOLN: 30.0000 mL | INTRAMUSCULAR | Status: DC | PRN

## 2024-01-28 MED ORDER — DIBUCAINE (PERIANAL) 1 % EX OINT: 1.0000 | TOPICAL_OINTMENT | CUTANEOUS | Status: DC | PRN

## 2024-01-28 MED ORDER — ACETAMINOPHEN 325 MG PO TABS
650.0000 mg | ORAL_TABLET | ORAL | Status: DC | PRN
  Administered 2024-01-28 (×2): 650 mg via ORAL
  Filled 2024-01-28 (×2): qty 2

## 2024-01-28 MED ORDER — BENZOCAINE-MENTHOL 20-0.5 % EX AERO: 1.0000 | INHALATION_SPRAY | CUTANEOUS | Status: DC | PRN

## 2024-01-28 NOTE — Anesthesia Postprocedure Evaluation (Signed)
 Anesthesia Post Note  Patient: Rebecca Noble  Procedure(s) Performed: AN AD HOC LABOR EPIDURAL     Patient location during evaluation: Mother Baby Anesthesia Type: Epidural Level of consciousness: awake and alert Pain management: pain level controlled Vital Signs Assessment: post-procedure vital signs reviewed and stable Respiratory status: spontaneous breathing, nonlabored ventilation and respiratory function stable Cardiovascular status: stable Postop Assessment: no headache, no backache and epidural receding Anesthetic complications: no   No notable events documented.  Last Vitals:  Vitals:   01/28/24 0457 01/28/24 0606  BP: 127/68 116/74  Pulse: 74 75  Resp: 16 16  Temp: 37 C 36.9 C  SpO2: 99% 98%    Last Pain:  Vitals:   01/28/24 0608  TempSrc:   PainSc: 0-No pain                 Blade Scheff

## 2024-01-28 NOTE — Lactation Note (Signed)
 This note was copied from a baby's chart. Lactation Consultation Note  Patient Name: Boy Amba Limones [id number] Date: 01/28/2024 Age:22 hours Reason for consult: Initial assessment;Term  P2, Baby has breastfed x 3 since birth. Baby sleeping skin to skin on mother's chest. Mother states she plans to breastfeed today and then will supplement with formula and pump. Discussed supply and demand. Set up DEBP with 24 mm flanges. Mother may start pumping tomorrow. Feed on demand with cues.  Goal 8-12+ times per day after first 24 hrs.  Place baby STS if not cueing.  Encouraged pumping if giving formula to stimulate her supply. Suggest call for latch assistance as needed.   Maternal Data Has patient been taught Hand Expression?: Yes Does the patient have breastfeeding experience prior to this delivery?: Yes How long did the patient breastfeed?: 8-9 mos.  Feeding Mother's Current Feeding Choice: Breast Milk and Formula  Lactation Tools Discussed/Used Tools: Pump;Flanges Flange Size: 24 Breast pump type: Double-Electric Breast Pump;Manual Pump Education: Setup, frequency, and cleaning;Milk Storage Reason for Pumping: mother's choice Pumping frequency: PRN  Interventions Interventions: Breast feeding basics reviewed;DEBP;Education;LC Services brochure;CDC milk storage guidelines  Discharge Pump: Hands Free;Personal  Consult Status Consult Status: Follow-up Date: 01/29/24 Follow-up type: In-patient  Luellen Sages  RN, IBCLC 01/28/2024, 9:33 AM

## 2024-01-28 NOTE — Anesthesia Procedure Notes (Signed)
 Epidural Patient location during procedure: OB Start time: 01/28/2024 1:07 AM End time: 01/28/2024 1:12 AM  Staffing Anesthesiologist: Rhenda Cedars, MD Performed: other anesthesia staff   Preanesthetic Checklist Completed: patient identified, IV checked, site marked, risks and benefits discussed, surgical consent, monitors and equipment checked, pre-op evaluation and timeout performed  Epidural Patient position: sitting Prep: DuraPrep and site prepped and draped Patient monitoring: continuous pulse ox and blood pressure Approach: midline Location: L4-L5 Injection technique: LOR air  Needle:  Needle type: Tuohy  Needle gauge: 17 G Needle length: 9 cm and 9 Needle insertion depth: 6 cm Catheter type: closed end flexible Catheter size: 19 Gauge Catheter at skin depth: 12 cm Test dose: negative  Assessment Events: blood not aspirated, no cerebrospinal fluid, injection not painful, no injection resistance, no paresthesia and negative IV test

## 2024-01-28 NOTE — Anesthesia Preprocedure Evaluation (Signed)
 Anesthesia Evaluation  Patient identified by MRN, date of birth, ID band Patient awake    Reviewed: Allergy & Precautions, H&P , NPO status , Patient's Chart, lab work & pertinent test results, reviewed documented beta blocker date and time   Airway Mallampati: I  TM Distance: >3 FB Neck ROM: full    Dental no notable dental hx. (+) Teeth Intact, Dental Advisory Given   Pulmonary neg pulmonary ROS, asthma    Pulmonary exam normal breath sounds clear to auscultation       Cardiovascular hypertension, Pt. on medications negative cardio ROS Normal cardiovascular exam Rhythm:regular Rate:Normal     Neuro/Psych negative neurological ROS  negative psych ROS   GI/Hepatic negative GI ROS, Neg liver ROS,,,  Endo/Other  negative endocrine ROS    Renal/GU negative Renal ROS  negative genitourinary   Musculoskeletal   Abdominal   Peds  Hematology negative hematology ROS (+) Blood dyscrasia, anemia   Anesthesia Other Findings   Reproductive/Obstetrics (+) Pregnancy                             Anesthesia Physical Anesthesia Plan  ASA: 2  Anesthesia Plan: Epidural   Post-op Pain Management: Minimal or no pain anticipated   Induction: Intravenous  PONV Risk Score and Plan: 2 and Treatment may vary due to age or medical condition  Airway Management Planned: Natural Airway and Simple Face Mask  Additional Equipment: Fetal Monitoring  Intra-op Plan:   Post-operative Plan:   Informed Consent: I have reviewed the patients History and Physical, chart, labs and discussed the procedure including the risks, benefits and alternatives for the proposed anesthesia with the patient or authorized representative who has indicated his/her understanding and acceptance.       Plan Discussed with: Anesthesiologist and CRNA  Anesthesia Plan Comments:        Anesthesia Quick Evaluation

## 2024-01-28 NOTE — Progress Notes (Signed)
 Postpartum Note Day #0  S:  Patient doing well.  Pain controlled.  Tolerating regular diet. Ambulating and voiding without difficulty.   Denies fevers, chills, chest pain, SOB, N/V, or worsening bilateral LE edema.  Lochia: Minimal Infant feeding:  Breast Circumcision:  Desires prior to discharge Contraception:  Mirena IUD  O: Temp:  [98 F (36.7 C)-98.6 F (37 C)] 98.4 F (36.9 C) (05/14 0606) Pulse Rate:  [70-148] 75 (05/14 0606) Resp:  [15-17] 16 (05/14 0606) BP: (103-144)/(48-89) 116/74 (05/14 0606) SpO2:  [98 %-100 %] 98 % (05/14 0606) Weight:  [72.6 kg] 72.6 kg (05/13 2257) Gen: NAD, pleasant and cooperative CV: Regular rate Resp: Normal work of breathing Abdomen: soft, non-distended, non-tender throughout Uterus: firm, non-tender, below umbilicus Ext: No bilateral LE edema, no bilateral calf tenderness, SCDs on and working  Labs:  Recent Labs    01/27/24 2347 01/28/24 0540  HGB 11.6* 10.0*  HCT 34.8* 29.5*    A/P: Patient is a 23 y.o. [id number] PPD#0 s/p SVD.  S/p SVD - Pain well controlled  - GU: UOP is adequate - GI: Tolerating regular diet - Activity: encouraged sitting up to chair and ambulation as tolerated - DVT Prophylaxis: Ambulation, SCDs - Labs: stable as above  Elevated blood pressures - Noted on admission but likely pain related, has not ruled in for hypertensive diagnosis - Normotensive to mildly low blood pressures since admission - Preeclampsia labs unremarkable, PCR 0.09  Circumcision Consent:  Routine circumcisions performed on newborns have been identified as voluntary, elective procedures by [employer] such as the [employer].  It is considered an elective procedure with no definitive medical indication and carries risks. Risks include but are not limited to bleeding, infection, damage to penis with possible need for further surgery, poor cosmesis, and local anesthetic risks. Circumcision will only be performed  if patient is deemed to have normal anatomy by his Pediatrician, meets adequate criteria for a newborn of similar gestational age after birth and is without infection or other medical issue contraindicating an elective procedure. Patient understands and agrees. Patient discussed with parents of infant.   Disposition:  D/C home PPD#1-2   Meldon Sport, DO

## 2024-01-29 ENCOUNTER — Other Ambulatory Visit (HOSPITAL_COMMUNITY): Payer: Self-pay

## 2024-01-29 MED ORDER — POTASSIUM CHLORIDE CRYS ER 20 MEQ PO TBCR
20.0000 meq | EXTENDED_RELEASE_TABLET | Freq: Two times a day (BID) | ORAL | Status: DC
  Administered 2024-01-29: 20 meq via ORAL
  Filled 2024-01-29: qty 1

## 2024-01-29 MED ORDER — POTASSIUM CHLORIDE CRYS ER 20 MEQ PO TBCR
20.0000 meq | EXTENDED_RELEASE_TABLET | Freq: Two times a day (BID) | ORAL | 0 refills | Status: AC
  Filled 2024-01-29: qty 5, 3d supply, fill #0

## 2024-01-29 NOTE — Discharge Summary (Signed)
 Postpartum Discharge Summary  Date of Service updated 01/29/2024     Patient Name: Rebecca Noble DOB: [birth year] MRN: [id number]  Date of admission: 01/27/2024 Delivery date:01/28/2024 Delivering provider: Marylu Soda Date of discharge: 01/29/2024  Admitting diagnosis: Delayed delivery after SROM (spontaneous rupture of membranes) [O42.90] Intrauterine pregnancy: [redacted]w[redacted]d     Secondary diagnosis:  Principal Problem:   Delayed delivery after SROM (spontaneous rupture of membranes)  Additional problems: None    Discharge diagnosis: Term Pregnancy Delivered                                              Post partum procedures:None Augmentation: N/A Complications: None  Hospital course: Induction of Labor With Vaginal Delivery   23 y.o. yo [id number] at [redacted]w[redacted]d was admitted to the hospital 01/27/2024 in active labor labor.    Patient had an uncomplicated labor course Membrane Rupture Time/Date: 10:30 PM,01/27/2024  Delivery Method:Vaginal, Spontaneous Operative Delivery:N/A Episiotomy: None Lacerations:  None Details of delivery can be found in separate delivery note.  Patient had a uncomplicated postpartum course . Patient is discharged home 01/29/24.  Newborn Data: Birth date:01/28/2024 Birth time:2:54 AM Gender:Female Living status:Living Apgars:9 ,9  Weight:3080 g  Magnesium  Sulfate received: No BMZ received: No Rhophylac:N/A MMR:N/A T-DaP:Given prenatally Flu: N/A RSV Vaccine received: No Transfusion:No Immunizations administered: There is no immunization history for the selected administration types on file for this patient.  Physical exam  Vitals:   01/28/24 1715 01/28/24 2029 01/28/24 2153 01/29/24 0613  BP: 125/76 139/78 121/77 108/67  Pulse: 79 84 84 74  Resp: 16 20  16   Temp: 98.5 F (36.9 C) 98.1 F (36.7 C)  97.7 F (36.5 C)  TempSrc: Oral Oral  Oral  SpO2:  100% 100% 100%  Weight:      Height:       General: alert, cooperative, and no  distress Lochia: appropriate Uterine Fundus: firm Incision: N/A DVT Evaluation: No evidence of DVT seen on physical exam. Labs: Lab Results  Component Value Date   WBC 16.2 (H) 01/28/2024   HGB 10.0 (L) 01/28/2024   HCT 29.5 (L) 01/28/2024   MCV 81.9 01/28/2024   PLT 264 01/28/2024      Latest Ref Rng & Units 01/27/2024   11:47 PM  CMP  Glucose 70 - 99 mg/dL 95   BUN 6 - 20 mg/dL <5   Creatinine 6.29 - 1.00 mg/dL 5.28   Sodium 413 - 244 mmol/L 138   Potassium 3.5 - 5.1 mmol/L 3.0   Chloride 98 - 111 mmol/L 108   CO2 22 - 32 mmol/L 18   Calcium 8.9 - 10.3 mg/dL 9.2   Total Protein 6.5 - 8.1 g/dL 7.0   Total Bilirubin 0.0 - 1.2 mg/dL 0.8   Alkaline Phos 38 - 126 U/L 109   AST 15 - 41 U/L 22   ALT 0 - 44 U/L 15    Edinburgh Score:    01/29/2024   10:00 AM  Edinburgh Postnatal Depression Scale Screening Tool  I have been able to laugh and see the funny side of things. 0  I have looked forward with enjoyment to things. 0  I have blamed myself unnecessarily when things went wrong. 0  I have been anxious or worried for no good reason. 0  I have felt scared or panicky for no good reason. 0  Things have been getting on top of me. 0  I have been so unhappy that I have had difficulty sleeping. 0  I have felt sad or miserable. 0  I have been so unhappy that I have been crying. 0  The thought of harming myself has occurred to me. 0  Edinburgh Postnatal Depression Scale Total 0      After visit meds:  Allergies as of 01/29/2024       Reactions   Banana    Peanut-containing Drug Products    ALL NUTS   Shellfish Allergy    Soy Allergy (obsolete)    Tomato    "hives and SOB"        Medication List     STOP taking these medications    ondansetron  4 MG tablet Commonly known as: Zofran        TAKE these medications    cyclobenzaprine  10 MG tablet Commonly known as: FLEXERIL  Take 1 tablet (10 mg total) by mouth 2 (two) times daily as needed for muscle  spasms.   ibuprofen  600 MG tablet Commonly known as: ADVIL  Take 1 tablet (600 mg total) by mouth every 6 (six) hours.   iron  polysaccharides 150 MG capsule Commonly known as: NIFEREX Take 1 capsule (150 mg total) by mouth daily for 42 doses.   potassium chloride SA 20 MEQ tablet Commonly known as: KLOR-CON M Take 1 tablet (20 mEq total) by mouth 2 (two) times daily.   prenatal multivitamin Tabs tablet Take 1 tablet by mouth daily at 12 noon.         Discharge home in stable condition Infant Feeding: Bottle Infant Disposition:home with mother Discharge instruction: per After Visit Summary and Postpartum booklet. Activity: Advance as tolerated. Pelvic rest for 6 weeks.  Diet: routine diet Anticipated Birth Control: Unsure Postpartum Appointment:1 week Additional Postpartum F/U: BP check 1 week Future Appointments:No future appointments. Follow up Visit:  Follow-up Information     Meldon Sport, DO Follow up in 6 week(s).   Specialty: Obstetrics and Gynecology Why: Our office will call to schedule a 6 week postpartum visit for you. Contact information: [address] [place] [address] [phone]                     01/29/2024 Arlee Lace, MD

## 2024-01-29 NOTE — Lactation Note (Signed)
 This note was copied from a baby's chart. Lactation Consultation Note  Patient Name: Rebecca Noble [id number] Date: 01/29/2024 Age:22 hours Reason for consult: Follow-up assessment;Term  P2, Mother has started supplemented with formula, volumes 9-12 ml.  Discussed increasing per day of life if baby is not latching. Mother denies nipple soreness. Discussed offering the breast before formula to help establish mother's milk supply. She states she did pump once last night for stimulation. Reviewed engorgement care and monitoring voids/stools. Offered to assist mother with latching and mother declined needing assistance with latching.   Maternal Data Has patient been taught Hand Expression?: Yes  Feeding Mother's Current Feeding Choice: Breast Milk and Formula Nipple Type: Slow - flow  Interventions Interventions: Education  Discharge Discharge Education: Engorgement and breast care;Warning signs for feeding baby Pump: Personal;Hands Free  Consult Status Consult Status: Follow-up Date: 01/30/24 Follow-up type: In-patient  Vicenta Graft Boschen  RN, IBCLC 01/29/2024, 8:16 AM

## 2024-02-05 ENCOUNTER — Inpatient Hospital Stay (HOSPITAL_COMMUNITY)

## 2024-02-05 ENCOUNTER — Inpatient Hospital Stay (HOSPITAL_COMMUNITY): Admission: AD | Admit: 2024-02-05 | Source: Home / Self Care | Admitting: Obstetrics and Gynecology

## 2024-02-11 ENCOUNTER — Telehealth (HOSPITAL_COMMUNITY): Payer: Self-pay | Admitting: *Deleted

## 2024-02-11 NOTE — Telephone Encounter (Signed)
 02/11/2024  Name: Rebecca Noble MRN: [id number] DOB: 06-12-2001  Reason for Call:  Transition of Care Hospital Discharge Call  Contact Status: Patient Contact Status: Message  Language assistant needed:          Follow-Up Questions:    Dimple Francis Postnatal Depression Scale:  In the Past 7 Days:    PHQ2-9 Depression Scale:     Discharge Follow-up:    Post-discharge interventions: NA  Pearlie Bougie, RN 02/11/2024 15:15

## 2024-07-22 DIAGNOSIS — Z3009 Encounter for other general counseling and advice on contraception: Secondary | ICD-10-CM | POA: Diagnosis not present
